# Patient Record
Sex: Female | Born: 1960 | Race: White | Hispanic: No | Marital: Married | Smoking: Former smoker
Health system: Southern US, Community
[De-identification: ages and names within clinical notes are randomized; demographics above are authoritative.]

## PROBLEM LIST (undated history)

## (undated) DIAGNOSIS — E119 Type 2 diabetes mellitus without complications: Secondary | ICD-10-CM

## (undated) HISTORY — DX: Type 2 diabetes mellitus without complications: E11.9

---
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
## 2015-04-24 LAB — HEPATIC FUNCTION PANEL
ALT: 25 U/L (ref 7–35)
AST: 21 U/L (ref 13–35)

## 2015-04-24 LAB — BASIC METABOLIC PANEL
BUN: 17 mg/dL (ref 4–21)
Creatinine: 0.9 mg/dL (ref <=1.1)
Potassium: 5 mmol/L (ref 3.4–5.3)
Sodium: 138 mmol/L (ref 137–147)

## 2015-07-14 LAB — BASIC METABOLIC PANEL: Glucose: 94 mg/dL

## 2015-08-31 LAB — HM DIABETES EYE EXAM

## 2015-10-20 ENCOUNTER — Other Ambulatory Visit: Payer: Self-pay

## 2015-10-20 DIAGNOSIS — Z1231 Encounter for screening mammogram for malignant neoplasm of breast: Secondary | ICD-10-CM

## 2015-10-23 LAB — LIPID PANEL
Cholesterol: 196 mg/dL (ref 0–200)
HDL: 147 mg/dL — AB (ref 35–70)
Triglycerides: 145 mg/dL (ref 40–160)

## 2015-10-23 LAB — HEMOGLOBIN A1C: Hemoglobin A1C: 8.3

## 2015-11-03 ENCOUNTER — Other Ambulatory Visit: Payer: Self-pay | Admitting: Physician Assistant

## 2015-11-03 ENCOUNTER — Ambulatory Visit
Admission: RE | Admit: 2015-11-03 | Discharge: 2015-11-03 | Disposition: A | Discharge status: Home / Self Care | Payer: Managed Care, Other (non HMO) | Source: Ambulatory Visit

## 2015-11-03 DIAGNOSIS — Z1231 Encounter for screening mammogram for malignant neoplasm of breast: Secondary | ICD-10-CM

## 2016-02-02 ENCOUNTER — Encounter: Payer: Self-pay | Admitting: Internal Medicine

## 2016-02-02 ENCOUNTER — Ambulatory Visit (INDEPENDENT_AMBULATORY_CARE_PROVIDER_SITE_OTHER): Payer: BC Managed Care – PPO | Admitting: Internal Medicine

## 2016-02-02 VITALS — BP 110/62 | HR 75 | Ht 67.5 in | Wt 213.0 lb

## 2016-02-02 DIAGNOSIS — E1165 Type 2 diabetes mellitus with hyperglycemia: Secondary | ICD-10-CM | POA: Insufficient documentation

## 2016-02-02 LAB — POCT GLYCOSYLATED HEMOGLOBIN (HGB A1C): Hemoglobin A1C: 7.2

## 2016-02-02 MED ORDER — GLIPIZIDE 5 MG PO TABS: 5.0000 mg | ORAL_TABLET | Freq: Two times a day (BID) | ORAL | 3 refills | Status: DC

## 2016-02-02 MED ORDER — SITAGLIPTIN PHOSPHATE 100 MG PO TABS
100.0000 mg | ORAL_TABLET | Freq: Every day | ORAL | 5 refills | Status: DC
Start: 2016-02-02 — End: 2016-07-21

## 2016-02-02 NOTE — Progress Notes (Signed)
Patient ID: Shelly LeaderChristine Babineaux, female   DOB: 31-Mar-1961, 55 y.o.   MRN: [id number]   HPI: Shelly Morgan is a 55 y.o.-year-old female, referred by her PCP, Manon HildingJessica Mauney, PA, for management of DM2, dx in 2008, GDM initially in 2002, non-insulin-dependent, uncontrolled, without long term complications. On BCBS.  Last hemoglobin A1c was: 01/25/2016: 7.4% Lab Results  Component Value Date   HGBA1C 8.3 10/23/2015  04/24/2015: HbA1c 7.9% 04/21/2014: HbA1c 6.9%  Pt is on a regimen of: - Metformin 1000 mg 2x a day, with meals - Amaryl 4 mg 2x a day before meals Tried Trulicity 0.75 mg weekly - started 10/2015 >> stopped 12/15/2015 Victoza was not covered.  Pt checks her sugars 1x a day and they are: - am: 135-175, 191 - 2h after b'fast: n/c - before lunch: 150-173 - 2h after lunch: 149 - before dinner: 95-131, 260 - 2h after dinner: n/c - bedtime: 178 - nighttime: n/c No lows. Lowest sugar was 95; she has hypoglycemia awareness at 95.  Highest sugar was 260.  Glucometer: One Touch Verio  Pt's meals are: - Breakfast: Enterex or Eggs  + 1/2 Bagel - Lunch: 1/2 PB sandwich or salad or veggies or Enterex - Dinner: chicken or pork + veggies or salad  - Snacks: 2: cottage cheese, fruit, veggies, nuts Drinks: 1 diet sodas a day (Dr Reino KentPepper). No coffee or tea. She has seen nutrition 3 times in the past. For exercise, she walks 3-5 times a week  - no CKD, last BUN/creatinine:  Lab Results  Component Value Date   BUN 17 04/24/2015   CREATININE 0.9 04/24/2015   - last set of lipids: Lab Results  Component Value Date   CHOL 196 10/23/2015   HDL 147 (A) 10/23/2015   TRIG 145 10/23/2015  LDL 118 - last eye exam was in Spring 2017. No DR.  - no numbness and tingling in her feet. On ASA 81 mg.  Pt has FH of DM in father.  ROS: Constitutional: no weight gain/loss, no fatigue, no subjective hyperthermia/hypothermia, + Nocturia Eyes: no blurry vision, no xerophthalmia ENT: no sore  throat, no nodules palpated in throat, no dysphagia/odynophagia, no hoarseness Cardiovascular: no CP/SOB/palpitations/leg swelling Respiratory: no cough/SOB Gastrointestinal: no N/V/D/C Musculoskeletal: no muscle/joint aches Skin: no rashes Neurological: no tremors/numbness/tingling/dizziness Psychiatric: no depression/anxiety  Past Medical History:  Diagnosis Date  . Diabetes mellitus without complication [facility](HCC)    Past Surgical History:  Procedure Laterality Date  . APPENDECTOMY  1974  . CESAREAN SECTION  2002  . OVARIAN CYST SURGERY  1974   Social History   Social History  . Marital status: Married    Spouse name: N/A  . Number of children: 1   Occupational History  . 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    Social History Main Topics  . Smoking status: Former Smoker    Types: Cigarettes    Quit date: 1997  . Smokeless tobacco: Not on file  . Alcohol use Yes     Comment: 1-2 year  . Drug use: no   Current Outpatient Prescriptions on File Prior to Visit  Medication Sig Dispense Refill  . aspirin 81 MG chewable tablet Chew by mouth daily.    . diphenhydrAMINE (BENADRYL) 25 mg capsule Take 25 mg by mouth 2 (two) times daily.    Marland Kitchen. glimepiride (AMARYL) 4 MG tablet Take 4 mg by mouth 2 (two) times daily.    .Marland Kitchen  metFORMIN (GLUCOPHAGE) 1000 MG tablet Take 1,000 mg by mouth 2 (two) times daily with a meal.     No current facility-administered medications on file prior to visit.    Allergies  Allergen Reactions  . Codeine Nausea And Vomiting  . Morphine And Related Nausea And Vomiting   Family History  Problem Relation Age of Onset  . COPD Mother   . Diverticulitis Father   . Crohn's disease Brother    PE: BP 110/62 (BP Location: Left Arm, Patient Position: Sitting)   Pulse 75   Ht 5' 7.5" (1.715 m)   Wt 213 lb (96.6 kg)   SpO2 94%   BMI 32.87 kg/m  Wt Readings from Last 3  Encounters:  02/02/16 213 lb (96.6 kg)   Constitutional: overweight, in NAD Eyes: PERRLA, EOMI, no exophthalmos ENT: moist mucous membranes, no thyromegaly, no cervical lymphadenopathy Cardiovascular: RRR, No MRG Respiratory: CTA B Gastrointestinal: abdomen soft, NT, ND, BS+ Musculoskeletal: no deformities, strength intact in all 4 Skin: moist, warm, no rashes Neurological: no tremor with outstretched hands, DTR normal in all 4  ASSESSMENT: 1. DM2, non-insulin-dependent, uncontrolled, without long term complications, but with hyperglycemia  PLAN:  1. Patient with long-standing, uncontrolled diabetes, on oral antidiabetic regimen, with improvement in DM control after adding Trulicity however, she could not tolerate this (even at the lowest dose, and 0.75 mg weekly) because of nausea. After she stopped the Trulicity a month and a half ago, she tried to exercise (walking) consistently, and started to change her diet. Her HbA1c 7.2%, definitely improved from the previous value of 8.3%. This could be the efect of Trulicity, but I believe that this is also due to her efforts towards improving her diabetes. - since she responded so well to Trulicity, will try to start a DPP 4 inhibitor to further help with her sugars. I strongly advised her to continue exercise and also suggested diet changes. - We'll also switch from Amaryl to glipizide. - I suggested to:  Patient Instructions  Please continue: - Metformin 1000 mg 2x a day, with meals  Please stop: - Amaryl  Please start: - Januvia 100 mg in am, before b'fast - Glipizide 5 mg 2x a day before b'fast and dinner  Please let me know if the sugars are consistently <80 or >200.  Please return in 1.5 months with your sugar log.   - Strongly advised her to start checking sugars at different times of the day - check 1-2 times a day, rotating checks - given sugar log and advised how to fill it and to bring it at next appt  - given foot care  handout and explained the principles  - given instructions for hypoglycemia management "15-15 rule"  - advised for yearly eye exams >> She is up-to-date - Return to clinic in 1.5 mo with sugar log   Carlus Pavlov, MD PhD [facility] Endocrinology

## 2016-02-02 NOTE — Addendum Note (Signed)
Addended by: Darene LamerHOMPSON, Azarias Chiou T on: 02/02/2016 09:24 AM   Modules accepted: Orders

## 2016-02-02 NOTE — Patient Instructions (Addendum)
Please continue: - Metformin 1000 mg 2x a day, with meals  Please stop: - Amaryl  Please start: - Januvia 100 mg in am, before b'fast - Glipizide 5 mg 2x a day before b'fast and dinner  Please let me know if the sugars are consistently <80 or >200.  Please return in 1.5 months with your sugar log.   PATIENT INSTRUCTIONS FOR TYPE 2 DIABETES:  **Please join MyChart!** - see attached instructions about how to join if you have not done so already.  DIET AND EXERCISE Diet and exercise is an important part of diabetic treatment.  We recommended aerobic exercise in the form of brisk walking (working between 40-60% of maximal aerobic capacity, similar to brisk walking) for 150 minutes per week (such as 30 minutes five days per week) along with 3 times per week performing 'resistance' training (using various gauge rubber tubes with handles) 5-10 exercises involving the major muscle groups (upper body, lower body and core) performing 10-15 repetitions (or near fatigue) each exercise. Start at half the above goal but build slowly to reach the above goals. If limited by weight, joint pain, or disability, we recommend daily walking in a swimming pool with water up to waist to reduce pressure from joints while allow for adequate exercise.    BLOOD GLUCOSES Monitoring your blood glucoses is important for continued management of your diabetes. Please check your blood glucoses 2-4 times a day: fasting, before meals and at bedtime (you can rotate these measurements - e.g. one day check before the 3 meals, the next day check before 2 of the meals and before bedtime, etc.).   HYPOGLYCEMIA (low blood sugar) Hypoglycemia is usually a reaction to not eating, exercising, or taking too much insulin/ other diabetes drugs.  Symptoms include tremors, sweating, hunger, confusion, headache, etc. Treat IMMEDIATELY with 15 grams of Carbs: . 4 glucose tablets .  cup regular juice/soda . 2 tablespoons raisins . 4  teaspoons sugar . 1 tablespoon honey Recheck blood glucose in 15 mins and repeat above if still symptomatic/blood glucose <100.  RECOMMENDATIONS TO REDUCE YOUR RISK OF DIABETIC COMPLICATIONS: * Take your prescribed MEDICATION(S) * Follow a DIABETIC diet: Complex carbs, fiber rich foods, (monounsaturated and polyunsaturated) fats * AVOID saturated/trans fats, high fat foods, >2,300 mg salt per day. * EXERCISE at least 5 times a week for 30 minutes or preferably daily.  * DO NOT SMOKE OR DRINK more than 1 drink a day. * Check your FEET every day. Do not wear tightfitting shoes. Contact us if you develop an ulcer * See your EYE doctor once a year or more if needed * Get a FLU shot once a year * Get a PNEUMONIA vaccine once before and once after age 56 years  GOALS:  * Your Hemoglobin A1c of <7%  * fasting sugars need to be <130 * after meals sugars need to be <180 (2h after you start eating) * Your Systolic BP should be 140 or lower  * Your Diastolic BP should be 80 or lower  * Your HDL (Good Cholesterol) should be 40 or higher  * Your LDL (Bad Cholesterol) should be 100 or lower. * Your Triglycerides should be 150 or lower  * Your Urine microalbumin (kidney function) should be <30 * Your Body Mass Index should be 25 or lower    Please consider the following ways to cut down carbs and fat and increase fiber and micronutrients in your diet: - substitute whole grain for white bread or  pasta - substitute brown rice for white rice - substitute 90-calorie flat bread pieces for slices of bread when possible - substitute sweet potatoes or yams for white potatoes - substitute humus for margarine - substitute tofu for cheese when possible - substitute almond or rice milk for regular milk (would not drink soy milk daily due to concern for soy estrogen influence on breast cancer risk) - substitute dark chocolate for other sweets when possible - substitute water - can add lemon or orange slices  for taste - for diet sodas (artificial sweeteners will trick your body that you can eat sweets without getting calories and will lead you to overeating and weight gain in the long run) - do not skip breakfast or other meals (this will slow down the metabolism and will result in more weight gain over time)  - can try smoothies made from fruit and almond/rice milk in am instead of regular breakfast - can also try old-fashioned (not instant) oatmeal made with almond/rice milk in am - order the dressing on the side when eating salad at a restaurant (pour less than half of the dressing on the salad) - eat as little meat as possible - can try juicing, but should not forget that juicing will get rid of the fiber, so would alternate with eating raw veg./fruits or drinking smoothies - use as little oil as possible, even when using olive oil - can dress a salad with a mix of balsamic vinegar and lemon juice, for e.g. - use agave nectar, stevia sugar, or regular sugar rather than artificial sweateners - steam or broil/roast veggies  - snack on veggies/fruit/nuts (unsalted, preferably) when possible, rather than processed foods - reduce or eliminate aspartame in diet (it is in diet sodas, chewing gum, etc) Read the labels!  Try to read Dr. Janene Harvey book: "Program for Reversing Diabetes" for other ideas for healthy eating.

## 2016-03-18 ENCOUNTER — Ambulatory Visit (INDEPENDENT_AMBULATORY_CARE_PROVIDER_SITE_OTHER): Payer: BC Managed Care – PPO | Admitting: Internal Medicine

## 2016-03-18 ENCOUNTER — Encounter: Payer: Self-pay | Admitting: Internal Medicine

## 2016-03-18 VITALS — BP 114/72 | HR 77 | Ht 67.5 in | Wt 212.0 lb

## 2016-03-18 DIAGNOSIS — E1165 Type 2 diabetes mellitus with hyperglycemia: Secondary | ICD-10-CM

## 2016-03-18 MED ORDER — EMPAGLIFLOZIN 25 MG PO TABS: 25.0000 mg | ORAL_TABLET | Freq: Every day | ORAL | 5 refills | Status: DC

## 2016-03-18 NOTE — Patient Instructions (Addendum)
Please continue: - Metformin 1000 mg 2x a day, with meals - Januvia 100 mg in am, before b'fast - Glipizide 5 mg 2x a day before b'fast and dinner  Add Jardiance 25 mg before b'fast.  If sugars in am are not <130 after 3 weeks, please try to move all Metformin (2000 mg) at dinnertime.  Please return in 1.5 months with your sugar log.

## 2016-03-18 NOTE — Progress Notes (Signed)
Patient ID: Shelly Morgan, female   DOB: 10-25-1960, 55 y.o.   MRN: [id number]   HPI: Shelly Morgan is a 55 y.o.-year-old female, initially referred by her PCP, Shelly Hilding, PA, for management of DM2, dx in 2008, GDM initially in 2002, non-insulin-dependent, uncontrolled, without long term complications. Last visit 6 weeks ago. On BC[employer].  Last hemoglobin A1c was: Lab Results  Component Value Date   HGBA1C 7.2 02/02/2016   HGBA1C 8.3 10/23/2015  01/25/2016: 7.4% 04/24/2015: HbA1c 7.9% 04/21/2014: HbA1c 6.9%  Pt was on a regimen of: - Metformin 1000 mg 2x a day, with meals - Amaryl 4 mg 2x a day before meals Tried Trulicity 0.75 mg weekly - started 10/2015 >> stopped 12/15/2015 b/c nausea, GERD Victoza was not covered.  At last visit, we switched to: - Metformin 1000 mg 2x a day, with meals - Januvia 100 mg in am, before b'fast - Glipizide 5 mg 2x a day before b'fast and dinner  Pt checks her sugars 1-2x a day and they are higher in last 2 weeks (? Reason): - am: 135-175, 191 >> 141-207 - 2h after b'fast: n/c >> 82, 112-224 - before lunch: 150-173 >> 99-107 - 2h after lunch: 149 >> 110-161 - before dinner: 95-131, 260 >> 70, 110-237 - 2h after dinner: n/c >> 142 - bedtime: 178 >> 94-140 - nighttime: n/c >> 189 No lows. Lowest sugar was 95 >> 70; she has hypoglycemia awareness at 95.  Highest sugar was 260 >> 237.  Glucometer: One Touch Verio  Pt's meals are: - Breakfast: Enterex or Eggs  + 1/2 Bagel - Lunch: 1/2 PB sandwich or salad or veggies or Enterex - Dinner: chicken or pork + veggies or salad  - Snacks: 2: cottage cheese, fruit, veggies, nuts Drinks: 1 diet sodas a day (Dr Reino Kent). No coffee or tea. She has seen nutrition 3 times in the past. For exercise, she walks 3-5 times a week  - no CKD, last BUN/creatinine:  Lab Results  Component Value Date   BUN 17 04/24/2015   CREATININE 0.9 04/24/2015   - last set of lipids: Lab Results  Component Value  Date   CHOL 196 10/23/2015   HDL 147 (A) 10/23/2015   TRIG 145 10/23/2015  LDL 118 - last eye exam was in Spring 2017. No DR.  - no numbness and tingling in her feet. On ASA 81 mg.  ROS: Constitutional: no weight gain/loss, no fatigue, no subjective hyperthermia/hypothermia, no Nocturia Eyes: no blurry vision, no xerophthalmia ENT: no sore throat, no nodules palpated in throat, no dysphagia/odynophagia, no hoarseness Cardiovascular: no CP/SOB/palpitations/leg swelling Respiratory: no cough/SOB Gastrointestinal: no N/V/D/C/+ heartburn Musculoskeletal: no muscle/joint aches Skin: no rashes Neurological: no tremors/numbness/tingling/dizziness  I reviewed pt's medications, allergies, PMH, social hx, family hx, and changes were documented in the history of present illness. Otherwise, unchanged from my initial visit note.  Past Medical History:  Diagnosis Date  . Diabetes mellitus without complication [facility])    Past Surgical History:  Procedure Laterality Date  . APPENDECTOMY  1974  . CESAREAN SECTION  2002  . OVARIAN CYST SURGERY  1974   Social History   Social History  . Marital status: Married    Spouse name: N/A  . Number of children: 1   Occupational History  . 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    Social History Main Topics  . Smoking status: Former Smoker  Types: Cigarettes    Quit date: [id number]  . Smokeless tobacco: Not on file  . Alcohol use Yes     Comment: 1-2 year  . Drug use: no   Current Outpatient Prescriptions on File Prior to Visit  Medication Sig Dispense Refill  . aspirin 81 MG chewable tablet Chew by mouth daily.    . diphenhydrAMINE (BENADRYL) 25 mg capsule Take 25 mg by mouth 2 (two) times daily.    Marland Kitchen. glipiZIDE (GLUCOTROL) 5 MG tablet Take 1 tablet (5 mg total) by mouth 2 (two) times daily before a meal. 180 tablet 3  . metFORMIN (GLUCOPHAGE) 1000 MG tablet Take  1,000 mg by mouth 2 (two) times daily with a meal.    . ONETOUCH DELICA LANCETS 33G MISC daily. use as directed  3  . ONETOUCH VERIO test strip     . sitaGLIPtin (JANUVIA) 100 MG tablet Take 1 tablet (100 mg total) by mouth daily. 30 tablet 5   No current facility-administered medications on file prior to visit.    Allergies  Allergen Reactions  . Codeine Nausea And Vomiting  . Morphine And Related Nausea And Vomiting   Family History  Problem Relation Age of Onset  . COPD Mother   . Diverticulitis Father   . Crohn's disease Brother    PE: BP 114/72 (BP Location: Left Arm, Patient Position: Sitting)   Pulse 77   Ht 5' 7.5" (1.715 m)   Wt 212 lb (96.2 kg)   SpO2 95%   BMI 32.71 kg/m  Wt Readings from Last 3 Encounters:  03/18/16 212 lb (96.2 kg)  02/02/16 213 lb (96.6 kg)   Constitutional: overweight, in NAD Eyes: PERRLA, EOMI, no exophthalmos ENT: moist mucous membranes, no thyromegaly, no cervical lymphadenopathy Cardiovascular: RRR, No MRG Respiratory: CTA B Gastrointestinal: abdomen soft, NT, ND, BS+ Musculoskeletal: no deformities, strength intact in all 4 Skin: moist, warm, no rashes Neurological: no tremor with outstretched hands, DTR normal in all 4  ASSESSMENT: 1. DM2, non-insulin-dependent, uncontrolled, without long term complications, but with hyperglycemia  PLAN:  1. Patient with long-standing, uncontrolled diabetes, on oral antidiabetic regimen, with improvement in DM control after adding Trulicity however, she could not tolerate this (even at the lowest dose, and 0.75 mg weekly) because of nausea. After she stopped Trulicity a month and a half ago, she started to exercise (walking) consistently, and started to change her diet. Her HbA1c 7.2%, definitely improved from the previous value of 8.3%. At last visit, we changed from Amaryl to Glipizide and Januvia. Sugars are worse in am. Will add Jardiance. - I suggested to:  Patient Instructions  Please  continue: - Metformin 1000 mg 2x a day, with meals - Januvia 100 mg in am, before b'fast - Glipizide 5 mg 2x a day before b'fast and dinner  Add Jardiance 25 mg before b'fast.  If sugars in am are not <130 after 3 weeks, please try to move all Metformin (2000 mg) at dinnertime.  Please return in 1.5 months with your sugar log.   - continue checking sugars at different times of the day - check 1-2 times a day, rotating checks - advised for yearly eye exams >> She is up-to-date - refuses flu shot: "I never get sick" - Return to clinic in 1.5 mo with sugar log   Carlus Pavlovristina Duvid Smalls, MD PhD [facility]

## 2016-03-25 ENCOUNTER — Telehealth: Payer: Self-pay | Admitting: Internal Medicine

## 2016-03-25 NOTE — Telephone Encounter (Signed)
Patient stated she has a yeast infection form the medication Jardiance,  Can you prescribe something  For it. Send to [id number]- [place], [place] - [address] [phone] (Phone) [phone] (Fax)

## 2016-03-26 ENCOUNTER — Other Ambulatory Visit: Payer: Self-pay

## 2016-03-26 MED ORDER — FLUCONAZOLE 150 MG PO TABS: 150.0000 mg | ORAL_TABLET | Freq: Once | ORAL | 1 refills | Status: AC

## 2016-03-26 NOTE — Telephone Encounter (Signed)
Yes, Diflucan 150 mg 1 tablet with one refill

## 2016-03-26 NOTE — Telephone Encounter (Signed)
Patient telephoned again wondering where the prescription for her yeast is.

## 2016-04-29 ENCOUNTER — Other Ambulatory Visit (HOSPITAL_COMMUNITY)
Admission: RE | Admit: 2016-04-29 | Discharge: 2016-04-29 | Disposition: A | Discharge status: Home / Self Care | Payer: BC Managed Care – PPO | Source: Ambulatory Visit | Attending: Physician Assistant | Admitting: Physician Assistant

## 2016-04-29 ENCOUNTER — Other Ambulatory Visit: Payer: Self-pay | Admitting: Physician Assistant

## 2016-04-29 DIAGNOSIS — Z124 Encounter for screening for malignant neoplasm of cervix: Secondary | ICD-10-CM | POA: Diagnosis present

## 2016-04-29 DIAGNOSIS — Z1151 Encounter for screening for human papillomavirus (HPV): Secondary | ICD-10-CM | POA: Diagnosis not present

## 2016-05-02 LAB — CYTOLOGY - PAP
Diagnosis: UNDETERMINED — AB
HPV: NOT DETECTED

## 2016-05-08 ENCOUNTER — Ambulatory Visit: Payer: BC Managed Care – PPO | Admitting: Internal Medicine

## 2016-06-25 ENCOUNTER — Ambulatory Visit (INDEPENDENT_AMBULATORY_CARE_PROVIDER_SITE_OTHER): Payer: BC Managed Care – PPO | Admitting: Internal Medicine

## 2016-06-25 ENCOUNTER — Encounter: Payer: Self-pay | Admitting: Internal Medicine

## 2016-06-25 VITALS — BP 118/80 | HR 74 | Wt 207.0 lb

## 2016-06-25 DIAGNOSIS — E1165 Type 2 diabetes mellitus with hyperglycemia: Secondary | ICD-10-CM

## 2016-06-25 LAB — BASIC METABOLIC PANEL WITH GFR
BUN: 19 mg/dL (ref 7–25)
CO2: 26 mmol/L (ref 20–31)
Calcium: 9.7 mg/dL (ref 8.6–10.4)
Chloride: 105 mmol/L (ref 98–110)
Creat: 1.03 mg/dL (ref 0.50–1.05)
GFR, Est African American: 70 mL/min (ref >=60)
GFR, Est Non African American: 61 mL/min (ref >=60)
Glucose, Bld: 146 mg/dL — ABNORMAL HIGH (ref 65–99)
Potassium: 4.5 mmol/L (ref 3.5–5.3)
Sodium: 139 mmol/L (ref 135–146)

## 2016-06-25 LAB — POCT GLYCOSYLATED HEMOGLOBIN (HGB A1C): Hemoglobin A1C: 6.4

## 2016-06-25 NOTE — Patient Instructions (Addendum)
Please continue: - Metformin 2000 mg 1x a day, with dinner - Januvia 100 mg in am, before b'fast - Glipizide 5 mg 2x a day before b'fast and dinner  - Jardiance 25 mg before b'fast.  Please return in 3 months with your sugar log.   Build up exercise.  Please stop at the lab.

## 2016-06-25 NOTE — Progress Notes (Signed)
Patient ID: Shelly Morgan, female   DOB: [birth year], 56 y.o.   MRN: [id number]   HPI: Shelly Morgan is a 56 y.o.-year-old female, initially referred by her PCP, Manon Hilding, PA, for management of DM2, dx in 2008, GDM initially in 2002, non-insulin-dependent, uncontrolled, without long term complications. Last visit 4 mo ago. On [employer].  She lost 5 lbs since last visit. She is eating mostly a vegetarian diet.   Last hemoglobin A1c was: Lab Results  Component Value Date   HGBA1C 7.2 02/02/2016   HGBA1C 8.3 10/23/2015  01/25/2016: 7.4% 04/24/2015: HbA1c 7.9% 04/21/2014: HbA1c 6.9%  Pt was on a regimen of: - Metformin 1000 mg 2x a day - Amaryl 4 mg 2x a day before meals Tried Trulicity 0.75 mg weekly - started 10/2015 >> stopped 12/15/2015 b/c nausea, GERD Victoza was not covered.  She is on: - Metformin 2000 mg with dinner - Januvia 100 mg in am, before b'fast - Glipizide 5 mg 2x a day before b'fast and dinner  - Jardiance 25 mg before b'fast. - started 03/2016 - had 1 yeast inf.  Pt checks her sugars 1-2x a day: - am: 135-175, 191 >> 141-207 >> 146-196 - 2h after b'fast: n/c >> 82, 112-224 >> 137-203, 295 - before lunch: 150-173 >> 99-107 >> 139 - 2h after lunch: 149 >> 110-161 >> 116-174 - before dinner: 95-131, 260 >> 70, 110-237 >> 89, 116-154 - 2h after dinner: n/c >> 142 >> 119 - bedtime: 178 >> 94-140 >> 114-217 - nighttime: n/c >> 189 No lows. Lowest sugar was 95 >> 70 >> 89; she has hypoglycemia awareness at 95.  Highest sugar was 260 >> 237 >> 295 (cereals).  Glucometer: One Touch Verio  Pt's meals are: - Breakfast: Enterex or Eggs  + 1/2 Bagel - Lunch: 1/2 PB sandwich or salad or veggies or Enterex - Dinner: chicken or pork + veggies or salad  - Snacks: 2: cottage cheese, fruit, veggies, nuts Drinks: 1 diet sodas a day (Dr Reino Kent). No coffee or tea. She has seen nutrition 3 times in the past. For exercise, she walks 3-5 times a week 1 mile - 18 min.  -  no CKD, last BUN/creatinine:  Lab Results  Component Value Date   BUN 17 04/24/2015   CREATININE 0.9 04/24/2015   - last set of lipids: Lab Results  Component Value Date   CHOL 196 10/23/2015   HDL 147 (A) 10/23/2015   TRIG 145 10/23/2015  LDL 118 - last eye exam was in Spring 2017. No DR.  - no numbness and tingling in her feet. On ASA 81 mg.  ROS: Constitutional: no weight gain/loss, no fatigue, no subjective hyperthermia/hypothermia, no Nocturia Eyes: no blurry vision, no xerophthalmia ENT: no sore throat, no nodules palpated in throat, no dysphagia/odynophagia, no hoarseness Cardiovascular: no CP/SOB/palpitations/leg swelling Respiratory: no cough/SOB Gastrointestinal: no N/V/D/C/heartburn Musculoskeletal: no muscle/joint aches Skin: no rashes Neurological: no tremors/numbness/tingling/dizziness  I reviewed pt's medications, allergies, PMH, social hx, family hx, and changes were documented in the history of present illness. Otherwise, unchanged from my initial visit note.  Past Medical History:  Diagnosis Date  . Diabetes mellitus without complication [facility])    Past Surgical History:  Procedure Laterality Date  . APPENDECTOMY  1974  . CESAREAN SECTION  2002  . OVARIAN CYST SURGERY  1974   Social History   Social History  . Marital status: Married    Spouse name: N/A  . Number of children: 1   Occupational  History  . 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    Social History Main Topics  . Smoking status: Former Smoker    Types: Cigarettes    Quit date: 1997  . Smokeless tobacco: Not on file  . Alcohol use Yes     Comment: 1-2 year  . Drug use: no   Current Outpatient Prescriptions on File Prior to Visit  Medication Sig Dispense Refill  . aspirin 81 MG chewable tablet Chew by mouth daily.    . diphenhydrAMINE (BENADRYL) 25 mg capsule Take 25 mg by mouth 2 (two) times daily.     . empagliflozin (JARDIANCE) 25 MG TABS tablet Take 25 mg by mouth daily. 30 tablet 5  . glipiZIDE (GLUCOTROL) 5 MG tablet Take 1 tablet (5 mg total) by mouth 2 (two) times daily before a meal. 180 tablet 3  . metFORMIN (GLUCOPHAGE) 1000 MG tablet Take 1,000 mg by mouth 2 (two) times daily with a meal.    . ONETOUCH DELICA LANCETS 33G MISC daily. use as directed  3  . ONETOUCH VERIO test strip     . sitaGLIPtin (JANUVIA) 100 MG tablet Take 1 tablet (100 mg total) by mouth daily. 30 tablet 5   No current facility-administered medications on file prior to visit.    Allergies  Allergen Reactions  . Codeine Nausea And Vomiting  . Morphine And Related Nausea And Vomiting   Family History  Problem Relation Age of Onset  . COPD Mother   . Diverticulitis Father   . Crohn's disease Brother    PE: BP 118/80 (BP Location: Right Arm, Patient Position: Sitting)   Pulse 74   Wt 207 lb (93.9 kg)   SpO2 97%   BMI 31.94 kg/m  Wt Readings from Last 3 Encounters:  06/25/16 207 lb (93.9 kg)  03/18/16 212 lb (96.2 kg)  02/02/16 213 lb (96.6 kg)   Constitutional: overweight, in NAD Eyes: PERRLA, EOMI, no exophthalmos ENT: moist mucous membranes, no thyromegaly, no cervical lymphadenopathy Cardiovascular: RRR, No MRG Respiratory: CTA B Gastrointestinal: abdomen soft, NT, ND, BS+ Musculoskeletal: no deformities, strength intact in all 4 Skin: moist, warm, no rashes Neurological: no tremor with outstretched hands, DTR normal in all 4  ASSESSMENT: 1. DM2, non-insulin-dependent, uncontrolled, without long term complications, but with hyperglycemia  PLAN:  1. Patient with long-standing, uncontrolled diabetes, on oral antidiabetic regimen, with improvement in DM control after adding Trulicity however, she could not tolerate this (even at the lowest dose, and 0.75 mg weekly) because of nausea. After she stopped Trulicity, she started to exercise (walking) consistently, and started to change her  diet. Her HbA1c improved but sugars high in am at last visit >> we added Jardiance. She had 1 yeast inf. Sugars remain high in am. Discussed about reducing fat in her diet and also starting a more intense exercise regimen. - HbA1c today was great: 6.4%! - I suggested to:  Patient Instructions  Please continue: - Metformin 1000 mg 2x a day, with meals - Januvia 100 mg in am, before b'fast - Glipizide 5 mg 2x a day before b'fast and dinner  - Jardiance 25 mg before b'fast.  Please return in 3 months with your sugar log.   Please stop at the lab.  - continue checking sugars at different times of the day - check 1-2 times a day, rotating checks - advised for yearly eye exams >> She is up-to-date -  will check potassium and GFR as we started SGLT2 inh at last visit. - refused flu shot: "I never get sick" - Return to clinic in 3 mo with sugar log   Office Visit on 06/25/2016  Component Date Value Ref Range Status  . Sodium 06/25/2016 139  135 - 146 mmol/L Final  . Potassium 06/25/2016 4.5  3.5 - 5.3 mmol/L Final  . Chloride 06/25/2016 105  98 - 110 mmol/L Final  . CO2 06/25/2016 26  20 - 31 mmol/L Final  . Glucose, Bld 06/25/2016 146* 65 - 99 mg/dL Final  . BUN 09/81/191401/23/2018 19  7 - 25 mg/dL Final  . Creat 78/29/562101/23/2018 1.03  0.50 - 1.05 mg/dL Final   Comment:   For patients > or = 57 years of age: The upper reference limit for Creatinine is approximately 13% higher for people identified as African-American.     . Calcium 06/25/2016 9.7  8.6 - 10.4 mg/dL Final  . GFR, Est African American 06/25/2016 70  >=60 mL/min Final  . GFR, Est Non African American 06/25/2016 61  >=60 mL/min Final  . Hemoglobin A1C 06/25/2016 6.4   Final     Carlus Pavlovristina Shadie Sweatman, MD PhD [facility]

## 2016-06-26 ENCOUNTER — Telehealth: Payer: Self-pay

## 2016-06-26 NOTE — Telephone Encounter (Signed)
Called patient and gave lab results. Patient had no questions or concerns.  

## 2016-07-21 ENCOUNTER — Other Ambulatory Visit: Payer: Self-pay | Admitting: Internal Medicine

## 2016-09-05 ENCOUNTER — Other Ambulatory Visit: Payer: Self-pay | Admitting: Internal Medicine

## 2016-09-25 ENCOUNTER — Ambulatory Visit: Payer: BC Managed Care – PPO | Admitting: Internal Medicine

## 2016-11-01 ENCOUNTER — Ambulatory Visit (INDEPENDENT_AMBULATORY_CARE_PROVIDER_SITE_OTHER): Payer: BC Managed Care – PPO | Admitting: Internal Medicine

## 2016-11-01 ENCOUNTER — Encounter: Payer: Self-pay | Admitting: Internal Medicine

## 2016-11-01 VITALS — BP 110/73 | HR 65 | Ht 67.5 in | Wt 208.8 lb

## 2016-11-01 DIAGNOSIS — E1165 Type 2 diabetes mellitus with hyperglycemia: Secondary | ICD-10-CM | POA: Diagnosis not present

## 2016-11-01 LAB — POCT GLYCOSYLATED HEMOGLOBIN (HGB A1C): Hemoglobin A1C: 6.6

## 2016-11-01 MED ORDER — DULAGLUTIDE 0.75 MG/0.5ML ~~LOC~~ SOAJ: 0.7500 mg | SUBCUTANEOUS | 3 refills | Status: DC

## 2016-11-01 MED ORDER — ASPIRIN 81 MG PO TBEC: 81.0000 mg | DELAYED_RELEASE_TABLET | Freq: Every day | ORAL | Status: DC

## 2016-11-01 NOTE — Progress Notes (Signed)
Patient ID: Shelly Morgan, female   DOB: 30-Apr-1961, 56 y.o.   MRN: [id number]   HPI: Shelly Morgan is a 56 y.o.-year-old female, initially referred by her PCP, Manon Hilding, PA, for management of DM2, dx in 2008, GDM initially in 2002, non-insulin-dependent, uncontrolled, without long term complications. Last visit 4.5 mo ago. On [employer].  Last hemoglobin A1c was: Lab Results  Component Value Date   HGBA1C 6.4 06/25/2016   HGBA1C 7.2 02/02/2016   HGBA1C 8.3 10/23/2015  01/25/2016: 7.4% 04/24/2015: HbA1c 7.9% 04/21/2014: HbA1c 6.9%  Pt was on a regimen of: - Metformin 1000 mg 2x a day - Amaryl 4 mg 2x a day before meals Tried Trulicity 0.75 mg weekly - started 10/2015 >> stopped 12/15/2015 b/c nausea, GERD Victoza was not covered.  She is now on: - Metformin 2000 mg with dinner - Januvia 100 mg in am, before b'fast - Glipizide 5 mg 2x a day before b'fast and dinner  - Jardiance 25 mg before b'fast. - started 03/2016 - had 1 yeast inf., but continues to have labial itching >> on a steroid cream + Azo cream 3x a day  Pt checks her sugars 1-2x a day: - am: 135-175, 191 >> 141-207 >> 146-196 >> 113-204 - 2h after b'fast: n/c >> 82, 112-224 >> 137-203, 295 >> 127, 128 - before lunch: 150-173 >> 99-107 >> 139 >> 131 - 2h after lunch: 149 >> 110-161 >> 116-174 >> 109, 111 - before dinner: 95-131, 260 >> 70, 110-237 >> 89, 116-154 >> 112-137, 188, 200 - 2h after dinner: n/c >> 142 >> 119 >> 101, 103 - bedtime: 178 >> 94-140 >> 114-217 >> 105 - nighttime: n/c >> 189 No lows. Lowest sugar was 95 >> 70 >> 89 >> 109; she has hypoglycemia awareness at 95.  Highest sugar was 260 >> 237 >> 295 (cereals) >> low 200s.  Glucometer: One Touch Verio  Pt's meals are: - Breakfast: Enterex or Eggs  + 1/2 Bagel - Lunch: 1/2 PB sandwich or salad or veggies or Enterex - Dinner: chicken or pork + veggies or salad  - Snacks: 2: cottage cheese, fruit, veggies, nuts Drinks: 1 diet sodas a day (Dr  Reino Kent). No coffee or tea. She has seen nutrition x3 in the past.  - No CKD, last BUN/creatinine:  Lab Results  Component Value Date   BUN 19 06/25/2016   BUN 17 04/24/2015   CREATININE 1.03 06/25/2016   CREATININE 0.9 04/24/2015   - Latest Lipids (reviewed [facility] records): Lab Results  Component Value Date   CHOL 196 10/23/2015   HDL 147 (A) 10/23/2015   TRIG 145 10/23/2015  LDL 118 - last eye exam was in Spring 2017. No DR. Scheduled 11/11/2016. - denies numbness and tingling in her feet. On ASA 81 mg.  ROS: Constitutional: no weight gain/no weight loss, no fatigue, no subjective hyperthermia, no subjective hypothermia Eyes: no blurry vision, no xerophthalmia ENT: no sore throat, no nodules palpated in throat, no dysphagia, no odynophagia, no hoarseness Cardiovascular: no CP/no SOB/no palpitations/no leg swelling Respiratory: no cough/no SOB/no wheezing Gastrointestinal: no N/no V/no D/no C/no acid reflux Musculoskeletal: no muscle aches/no joint aches Skin: no rashes, no hair loss, + vulvar itching Neurological: no tremors/no numbness/no tingling/no dizziness  I reviewed pt's medications, allergies, PMH, social hx, family hx, and changes were documented in the history of present illness. Otherwise, unchanged from my initial visit note.  Past Medical History:  Diagnosis Date  . Diabetes mellitus without complication (HCC)  Past Surgical History:  Procedure Laterality Date  . APPENDECTOMY  1974  . CESAREAN SECTION  2002  . OVARIAN CYST SURGERY  1974   Social History   Social History  . Marital status: Married    Spouse name: N/A  . Number of children: 1   Occupational History  . 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    Social History Main Topics  . Smoking status: Former Smoker    Types: Cigarettes    Quit date: 1997  . Smokeless tobacco: Not on file  . Alcohol use Yes      Comment: 1-2 year  . Drug use: no   Current Outpatient Prescriptions on File Prior to Visit  Medication Sig Dispense Refill  . aspirin 81 MG chewable tablet Chew by mouth daily.    . diphenhydrAMINE (BENADRYL) 25 mg capsule Take 25 mg by mouth 2 (two) times daily.    Marland Kitchen. glipiZIDE (GLUCOTROL) 5 MG tablet Take 1 tablet (5 mg total) by mouth 2 (two) times daily before a meal. 180 tablet 3  . JANUVIA 100 MG tablet TAKE 1 TABLET BY MOUTH EVERY DAY 30 tablet 5  . JARDIANCE 25 MG TABS tablet TAKE 1 TABLET BY MOUTH EVERY DAY 30 tablet 5  . metFORMIN (GLUCOPHAGE) 1000 MG tablet Take 1,000 mg by mouth 2 (two) times daily with a meal.    . ONETOUCH DELICA LANCETS 33G MISC daily. use as directed  3  . ONETOUCH VERIO test strip      No current facility-administered medications on file prior to visit.    Allergies  Allergen Reactions  . Codeine Nausea And Vomiting  . Morphine And Related Nausea And Vomiting   Family History  Problem Relation Age of Onset  . COPD Mother   . Diverticulitis Father   . Crohn's disease Brother    PE: BP 110/73   Pulse 65   Ht 5' 7.5" (1.715 m)   Wt 208 lb 12.8 oz (94.7 kg)   SpO2 98%   BMI 32.22 kg/m   Wt Readings from Last 3 Encounters:  11/01/16 208 lb 12.8 oz (94.7 kg)  06/25/16 207 lb (93.9 kg)  03/18/16 212 lb (96.2 kg)   Constitutional: overweight, in NAD Eyes: PERRLA, EOMI, no exophthalmos ENT: moist mucous membranes, no thyromegaly, no cervical lymphadenopathy Cardiovascular: RRR, No MRG Respiratory: CTA B Gastrointestinal: abdomen soft, NT, ND, BS+ Musculoskeletal: no deformities, strength intact in all 4 Skin: moist, warm, no rashes Neurological: no tremor with outstretched hands, DTR normal in all 4  ASSESSMENT: 1. DM2, non-insulin-dependent, uncontrolled, without long term complications, but with hyperglycemia  PLAN:  1. Patient with long-standing, uncontrolled diabetes, with better control later in the day in last few months, but still  high sugars in am.  - she is not tolerating well the Jardiance b/c vulvar itching >> will try to decrease the dose and if the itching persist >> will stop - she would like to retry Trulicity as she is not sure if her nausea that she experienced with it in the past was not related to something else >> we can do this if sugars higher off Jardiance. - I suggested to:  Patient Instructions  Please continue: - Metformin 1000 mg 2x a day, with meals - Januvia 100 mg in am, before b'fast - Glipizide 5 mg 2x a day before b'fast and dinner  Please decrease:  - Jardiance to 12.5  mg before b'fast for 1-2 weeks and if the itching persists, stop the Jardiance.  If you have to stop Jardiance and sugars increase >> try to restart Trulicity 0.75 mg weekly.   Please return in 3 months with your sugar log.   - today, HbA1c is 6.6% (slightly higher) - continue checking sugars at different times of the day - check 1x a day, rotating checks - advised for yearly eye exams >> she needs one >> scheduled - we need to check a BMP (if stays on Jardiance) + Lipids at next visit - Return to clinic in 3 mo with sugar log   Carlus Pavlov, MD PhD [facility]

## 2016-11-01 NOTE — Patient Instructions (Addendum)
Please continue: - Metformin 1000 mg 2x a day, with meals - Januvia 100 mg in am, before b'fast - Glipizide 5 mg 2x a day before b'fast and dinner  Please decrease:  - Jardiance to 12.5 mg before b'fast for 1-2 weeks and if the itching persists, stop the Jardiance.  If you have to stop Jardiance and sugars increase >> try to restart Trulicity 0.75 mg weekly.   Please return in 3 months with your sugar log.

## 2016-11-11 LAB — HM DIABETES EYE EXAM

## 2016-12-30 ENCOUNTER — Telehealth: Payer: Self-pay | Admitting: Internal Medicine

## 2016-12-30 ENCOUNTER — Other Ambulatory Visit: Payer: Self-pay

## 2016-12-30 MED ORDER — DULAGLUTIDE 0.75 MG/0.5ML ~~LOC~~ SOAJ: 0.7500 mg | SUBCUTANEOUS | 3 refills | Status: DC

## 2016-12-30 NOTE — Telephone Encounter (Signed)
Submitted

## 2016-12-30 NOTE — Telephone Encounter (Signed)
**  Remind patient they can make refill requests via MyChart**  Medication refill request (Name & Dosage): Dulaglutide (TRULICITY) 0.75 MG/0.5ML SOPN   Preferred pharmacy (Name & Address):  [id number]- [place], [place] - [address] [phone] (Phone) [phone] (Fax)      Other comments (if applicable):

## 2017-01-21 ENCOUNTER — Other Ambulatory Visit: Payer: Self-pay | Admitting: Internal Medicine

## 2017-02-06 ENCOUNTER — Telehealth: Payer: Self-pay | Admitting: Physician Assistant

## 2017-02-06 ENCOUNTER — Encounter: Payer: Self-pay | Admitting: Internal Medicine

## 2017-02-06 ENCOUNTER — Ambulatory Visit (INDEPENDENT_AMBULATORY_CARE_PROVIDER_SITE_OTHER): Payer: BC Managed Care – PPO | Admitting: Internal Medicine

## 2017-02-06 VITALS — BP 100/68 | Ht 68.0 in | Wt 212.2 lb

## 2017-02-06 DIAGNOSIS — E669 Obesity, unspecified: Secondary | ICD-10-CM | POA: Diagnosis not present

## 2017-02-06 DIAGNOSIS — E1165 Type 2 diabetes mellitus with hyperglycemia: Secondary | ICD-10-CM | POA: Diagnosis not present

## 2017-02-06 DIAGNOSIS — Z6832 Body mass index (BMI) 32.0-32.9, adult: Secondary | ICD-10-CM

## 2017-02-06 LAB — POCT GLYCOSYLATED HEMOGLOBIN (HGB A1C): Hemoglobin A1C: 6.6

## 2017-02-06 MED ORDER — DULAGLUTIDE 1.5 MG/0.5ML ~~LOC~~ SOAJ: SUBCUTANEOUS | 3 refills | Status: DC

## 2017-02-06 MED ORDER — METFORMIN HCL 1000 MG PO TABS: 1000.0000 mg | ORAL_TABLET | Freq: Two times a day (BID) | ORAL | 3 refills | Status: DC

## 2017-02-06 NOTE — Progress Notes (Signed)
Patient ID: Shelly Morgan, female   DOB: 1961/02/21, 56 y.o.   MRN: [id number]   HPI: Shelly Morgan is a 56 y.o.-year-old female, initially referred by her PCP, Manon Hilding, PA, for management of DM2, dx in 2008, GDM initially in 2002, non-insulin-dependent, uncontrolled, without long term complications. Last visit 3 mo ago. On [employer].  Since last visit, she had to come off Jardiance b/c vaginal itching >> restarted Trulicity.  Last hemoglobin A1c was: Lab Results  Component Value Date   HGBA1C 6.6 11/01/2016   HGBA1C 6.4 06/25/2016   HGBA1C 7.2 02/02/2016  01/25/2016: 7.4% 04/24/2015: HbA1c 7.9% 04/21/2014: HbA1c 6.9%  Pt was on a regimen of: - Metformin 1000 mg 2x a day - Amaryl 4 mg 2x a day before meals Tried Trulicity 0.75 mg weekly - started 10/2015 >> stopped 12/15/2015 b/c nausea, GERD Victoza was not covered.  She is now on: - Metformin ER 1000 mg 2x a day (did not move all Metformin at night - forgot) - Glipizide 5 mg 2x a day before b'fast and dinner - Trulicity restarted at 0.75 mg weekly after 11/2016 She was on Jardiance 25 mg before b'fast. - started 03/2016 - had 1 yeast inf., but continued to have labial itching >> on a steroid cream + Azo cream 3x a day >> but did not help >> stopped.  Pt checks her sugars 1-2x a day: - am: 135-175, 191 >> 141-207 >> 146-196 >> 113-204 >> 129, 156-203 - 2h after b'fast: n/c >> 82, 112-224 >> 137-203, 295 >> 127, 128 >> 71-115, 213 - before lunch: 150-173 >> 99-107 >> 139 >> 131 >> 109-130 - 2h after lunch: 149 >> 110-161 >> 116-174 >> 109, 111 >> 108-120, 231 - before dinner: 70, 110-237 >> 89, 116-154 >> 112-137, 188, 200 >> 96-181, 312 (gummy bears) - 2h after dinner: n/c >> 142 >> 119 >> 101, 103 >> 128 - bedtime: 178 >> 94-140 >> 114-217 >> 105 >> 105-140, 183 - nighttime: n/c >> 189 >. n/c No lows. Lowest sugar was 109 >> 71; she has hypoglycemia awareness at 90s.  Highest sugar was low 200s >> 312.  Glucometer:  One Touch Verio  Pt's meals are: - Breakfast: Enterex or Eggs  + 1/2 Bagel - Lunch: 1/2 PB sandwich or salad or veggies or Enterex - Dinner: chicken or pork + veggies or salad  - Snacks: 2: cottage cheese, fruit, veggies, nuts Drinks: 1 diet sodas a day (Dr Reino Kent). No coffee or tea. She has seen nutrition x3 in the past.  - No CKD, last BUN/creatinine:  Lab Results  Component Value Date   BUN 19 06/25/2016   BUN 17 04/24/2015   CREATININE 1.03 06/25/2016   CREATININE 0.9 04/24/2015   - Latest Lipids (per [facility] records): Lab Results  Component Value Date   CHOL 196 10/23/2015   HDL 147 (A) 10/23/2015   TRIG 145 10/23/2015  LDL 118 Not on a statin. - last eye exam was in 11/2016 >> No DR. - denies numbness and tingling in her feet. On ASA 81 mg daily.  ROS: Constitutional: no weight gain/no weight loss, no fatigue, no subjective hyperthermia, no subjective hypothermia Eyes: no blurry vision, no xerophthalmia ENT: no sore throat, no nodules palpated in throat, no dysphagia, no odynophagia, no hoarseness Cardiovascular: no CP/no SOB/no palpitations/no leg swelling Respiratory: no cough/no SOB/no wheezing Gastrointestinal: no N/no V/no D/no C/no acid reflux Musculoskeletal: no muscle aches/no joint aches Skin: no rashes, no hair loss Neurological: no  tremors/no numbness/no tingling/no dizziness  I reviewed pt's medications, allergies, PMH, social hx, family hx, and changes were documented in the history of present illness. Otherwise, unchanged from my initial visit note.  Past Medical History:  Diagnosis Date  . Diabetes mellitus without complication [facility](HCC)    Past Surgical History:  Procedure Laterality Date  . APPENDECTOMY  1974  . CESAREAN SECTION  2002  . OVARIAN CYST SURGERY  1974   Social History   Social History  . Marital status: Married    Spouse name: N/A  . Number of children: 1   Occupational History  . 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    Social History Main Topics  . Smoking status: Former Smoker    Types: Cigarettes    Quit date: 1997  . Smokeless tobacco: Not on file  . Alcohol use Yes     Comment: 1-2 year  . Drug use: no   Current Outpatient Prescriptions on File Prior to Visit  Medication Sig Dispense Refill  . diphenhydrAMINE (BENADRYL) 25 mg capsule Take 25 mg by mouth 2 (two) times daily.    . Dulaglutide (TRULICITY) 0.75 MG/0.5ML SOPN Inject 0.75 mg into the skin once a week. 4 pen 3  . glipiZIDE (GLUCOTROL) 5 MG tablet TAKE 1 TABLET BY MOUTH TWICE A DAY BEFORE A MEAL 180 tablet 3  . Homeopathic Products (AZO YEAST PLUS) TABS Take 3 tablets by mouth daily.    . hydrocortisone cream 1 % Apply 1 application topically 2 (two) times daily.    Marland Kitchen. JANUVIA 100 MG tablet TAKE 1 TABLET BY MOUTH EVERY DAY 30 tablet 5  . JARDIANCE 25 MG TABS tablet TAKE 1 TABLET BY MOUTH EVERY DAY 30 tablet 5  . metFORMIN (GLUCOPHAGE) 1000 MG tablet Take 1,000 mg by mouth 2 (two) times daily with a meal.    . ONETOUCH DELICA LANCETS 33G MISC daily. use as directed  3  . ONETOUCH VERIO test strip      Current Facility-Administered Medications on File Prior to Visit  Medication Dose Route Frequency Provider Last Rate Last Dose  . aspirin EC tablet 81 mg  81 mg Oral Daily Carlus PavlovGherghe, Khristian Seals, MD       Allergies  Allergen Reactions  . Codeine Nausea And Vomiting  . Morphine And Related Nausea And Vomiting   Family History  Problem Relation Age of Onset  . COPD Mother   . Diverticulitis Father   . Crohn's disease Brother    PE: BP 100/68   Ht 5\' 8"  (1.727 m)   Wt 212 lb 3.2 oz (96.3 kg)   BMI 32.26 kg/m  Wt Readings from Last 3 Encounters:  02/06/17 212 lb 3.2 oz (96.3 kg)  11/01/16 208 lb 12.8 oz (94.7 kg)  06/25/16 207 lb (93.9 kg)   Constitutional: overweight, in NAD Eyes: PERRLA, EOMI, no exophthalmos ENT: moist mucous membranes, no  thyromegaly, no cervical lymphadenopathy Cardiovascular: RRR, No MRG Respiratory: CTA B Gastrointestinal: abdomen soft, NT, ND, BS+ Musculoskeletal: no deformities, strength intact in all 4 Skin: moist, warm, no rashes Neurological: no tremor with outstretched hands, DTR normal in all 4  ASSESSMENT: 1. DM2, non-insulin-dependent, uncontrolled, without long term complications, but with hyperglycemia  2. Obesity class 1 BMI Classification:  < 18.5 underweight   18.5-24.9 normal weight   25.0-29.9 overweight   30.0-34.9 class I obesity   35.0-39.9 class II obesity   ?  40.0 class III obesity   PLAN:  1. Patient with long-standing, uncontrolled diabetes, with improved control after adding Jardiance, however, she could not continue with it due to vaginal itching despite using medicated creams. She stopped Jardiance 3 months ago and she restarted Trulicity 0.75 mg weekly. She continues on this dose now. Her sugars in a.m. are high, not at target and they improve later in the day unless she has dietary indiscretions, when the sugars can increase to 200s and even 300s. - As the sugars in a.m. are high, will move all metformin at night, which he has been doing in the past, but she cannot remember why she switched back to 1000 mg twice a day. - We will also increase Trulicity to target dose, of 1.5 mg weekly. - I suggested to:  Patient Instructions  Please move all Metformin at dinnertime (2000 mg).  Increase Trulicity to 1.5 mg weekly.  Please continue Glipizide 5 mg 2x a day before meals.  Please return in 3 months with your sugar log.   - today, HbA1c is 6.6% (stable) - continue checking sugars at different times of the day - check 1x a day, rotating checks - advised for yearly eye exams >> she is UTD - need to check a Lipid panel ut she mentions she had one at PCPs office - Return to clinic in 3 mo with sugar log    2. Obesity class 1 - gained 4 lbs since last visit - advised  her to buy a scale to have at home - will also increase Trulicity which should help  Carlus Pavlov, MD PhD [facility]

## 2017-02-06 NOTE — Patient Instructions (Addendum)
Please move all Metformin at dinnertime (2000 mg).  Increase Trulicity to 1.5 mg weekly.  Please continue Glipizide 5 mg 2x a day before meals.  Please return in 3 months with your sugar log.

## 2017-02-06 NOTE — Telephone Encounter (Signed)
error 

## 2017-05-08 ENCOUNTER — Ambulatory Visit: Payer: BC Managed Care – PPO | Admitting: Internal Medicine

## 2017-05-14 ENCOUNTER — Other Ambulatory Visit: Payer: Self-pay | Admitting: Family Medicine

## 2017-05-14 ENCOUNTER — Other Ambulatory Visit (HOSPITAL_COMMUNITY)
Admission: RE | Admit: 2017-05-14 | Discharge: 2017-05-14 | Disposition: A | Discharge status: Home / Self Care | Payer: BC Managed Care – PPO | Source: Ambulatory Visit | Attending: Family Medicine | Admitting: Family Medicine

## 2017-05-14 DIAGNOSIS — Z124 Encounter for screening for malignant neoplasm of cervix: Secondary | ICD-10-CM | POA: Insufficient documentation

## 2017-05-15 ENCOUNTER — Encounter: Payer: Self-pay | Admitting: Internal Medicine

## 2017-05-16 LAB — CYTOLOGY - PAP
Diagnosis: NEGATIVE
HPV: NOT DETECTED

## 2017-05-28 ENCOUNTER — Encounter: Payer: Self-pay | Admitting: Internal Medicine

## 2017-05-28 ENCOUNTER — Ambulatory Visit: Payer: BC Managed Care – PPO | Admitting: Internal Medicine

## 2017-05-28 VITALS — BP 108/70 | HR 84 | Ht 68.0 in | Wt 215.2 lb

## 2017-05-28 DIAGNOSIS — Z6832 Body mass index (BMI) 32.0-32.9, adult: Secondary | ICD-10-CM | POA: Diagnosis not present

## 2017-05-28 DIAGNOSIS — E669 Obesity, unspecified: Secondary | ICD-10-CM

## 2017-05-28 DIAGNOSIS — E1165 Type 2 diabetes mellitus with hyperglycemia: Secondary | ICD-10-CM | POA: Diagnosis not present

## 2017-05-28 LAB — POCT GLYCOSYLATED HEMOGLOBIN (HGB A1C): Hemoglobin A1C: 6.7

## 2017-05-28 MED ORDER — FREESTYLE LIBRE 14 DAY READER DEVI: 1.0000 | Freq: Once | 1 refills | Status: AC

## 2017-05-28 MED ORDER — FREESTYLE LIBRE 14 DAY SENSOR MISC: 1.0000 | 11 refills | Status: DC

## 2017-05-28 NOTE — Progress Notes (Addendum)
Patient ID: Shelly Morgan, female   DOB: 1960-10-20, 56 y.o.   MRN: [id number]   HPI: Shelly Morgan is a 56 y.o.-year-old female, initially referred by her PCP, Manon Hilding, PA, for management of DM2, dx in 2008, GDM initially in 2002, non-insulin-dependent, uncontrolled, without long term complications. Last visit 3.5 mo ago. On [employer].  Last hemoglobin A1c was: Lab Results  Component Value Date   HGBA1C 6.6 02/06/2017   HGBA1C 6.6 11/01/2016   HGBA1C 6.4 06/25/2016  01/25/2016: 7.4% 04/24/2015: HbA1c 7.9% 04/21/2014: HbA1c 6.9%  She is on: - Metformin ER  2000 mg with dinner - Glipizide 5 mg 2x a day before b'fast and dinner - Trulicity restarted at 0.75 mg weekly after 11/2016 >> 1.5 mg weekly 02/2017 She was on Jardiance 25 mg before b'fast. - started 03/2016 - had 1 yeast inf., but continued to have labial itching >> on a steroid cream + Azo cream 3x a day >> but did not help >> stopped. She tried Trulicity before (2017) and developed nausea and GERD. Victoza was not covered.  Pt checks her sugars 1-2 times a day: - am: 146-196 >> 113-204 >> 129, 156-203 >> 123-199, 202 - 2h after b'fast: 127, 128 >> 71-115, 213  >> 103-121 - before lunch: 139 >> 131 >> 109-130 >> 107-118 - 2h after lunch: 109, 111 >> 108-120, 231 >> 100-134 - before dinner:112-137, 188, 200 >> 96-181, 312 >> 109-122 - 2h after dinner: 142 >> 119 >> 101, 103 >> 128 >> 127-150 - bedtime: 114-217 >> 105 >> 105-140, 183 >> 107-109 - nighttime: n/c >> 189 >. N/c >> 135 Lowest sugar was 109 >> 71 >> 123; she has hypoglycemia awareness in the 90s.  Highest sugar was low 200s >> 312 >> 202.  Glucometer: One Touch Verio  Pt's meals are: - Breakfast: Enterex or Eggs  + 1/2 Bagel - Lunch: 1/2 PB sandwich or salad or veggies or Enterex - Dinner: chicken or pork + veggies or salad  - Snacks: 2: cottage cheese, fruit, veggies, nuts Drinks: 1 diet sodas a day (Dr Reino Kent). No coffee or tea. She has seen  nutrition 3 times in the past.  - No CKD, last BUN/creatinine:  Lab Results  Component Value Date   BUN 19 06/25/2016   BUN 17 04/24/2015   CREATININE 1.03 06/25/2016   CREATININE 0.9 04/24/2015   -+ HL; latest Lipids  2 weeks ago >> will ask for records (per [facility] records): Lab Results  Component Value Date   CHOL 196 10/23/2015   HDL 147 (A) 10/23/2015   TRIG 145 10/23/2015  LDL 118 Not on a statin - last eye exam was in 11/2016: No DR -Denies numbness and tingling in her feet. On ASA 81 mg daily.  ROS: Constitutional: no weight gain/no weight loss, no fatigue, no subjective hyperthermia, no subjective hypothermia Eyes: no blurry vision, no xerophthalmia ENT: no sore throat, no nodules palpated in throat, no dysphagia, no odynophagia, no hoarseness Cardiovascular: no CP/no SOB/no palpitations/no leg swelling Respiratory: no cough/no SOB/no wheezing Gastrointestinal: no N/no V/no D/no C/no acid reflux Musculoskeletal: no muscle aches/no joint aches Skin: no rashes, no hair loss Neurological: no tremors/no numbness/no tingling/no dizziness  I reviewed pt's medications, allergies, PMH, social hx, family hx, and changes were documented in the history of present illness. Otherwise, unchanged from my initial visit note.  Past Medical History:  Diagnosis Date  . Diabetes mellitus without complication [facility])    Social History   Social History  .  Marital status: Married    Spouse name: N/A  . Number of children: 1   Occupational History  . 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    Social History Main Topics  . Smoking status: Former Smoker    Types: Cigarettes    Quit date: 1997  . Smokeless tobacco: Not on file  . Alcohol use Yes     Comment: 1-2 year  . Drug use: no   Current Outpatient Medications on File Prior to Visit  Medication Sig Dispense Refill  . diphenhydrAMINE  (BENADRYL) 25 mg capsule Take 25 mg by mouth 2 (two) times daily.    . Dulaglutide (TRULICITY) 1.5 MG/0.5ML SOPN Inject 1.5 mg under skin weekly 12 pen 3  . glipiZIDE (GLUCOTROL) 5 MG tablet TAKE 1 TABLET BY MOUTH TWICE A DAY BEFORE A MEAL 180 tablet 3  . metFORMIN (GLUCOPHAGE) 1000 MG tablet Take 1 tablet (1,000 mg total) by mouth 2 (two) times daily with a meal. 180 tablet 3  . ONETOUCH DELICA LANCETS 33G MISC daily. use as directed  3  . ONETOUCH VERIO test strip      No current facility-administered medications on file prior to visit.    Allergies  Allergen Reactions  . Codeine Nausea And Vomiting  . Morphine And Related Nausea And Vomiting   Family History  Problem Relation Age of Onset  . COPD Mother   . Diverticulitis Father   . Crohn's disease Brother    PE: BP 108/70   Pulse 84   Ht 5\' 8"  (1.727 m)   Wt 215 lb 3.2 oz (97.6 kg)   SpO2 96%   BMI 32.72 kg/m  Wt Readings from Last 3 Encounters:  05/28/17 215 lb 3.2 oz (97.6 kg)  02/06/17 212 lb 3.2 oz (96.3 kg)  11/01/16 208 lb 12.8 oz (94.7 kg)   Constitutional: overweight, in NAD Eyes: PERRLA, EOMI, no exophthalmos ENT: moist mucous membranes, no thyromegaly, no cervical lymphadenopathy Cardiovascular: RRR, No MRG Respiratory: CTA B Gastrointestinal: abdomen soft, NT, ND, BS+ Musculoskeletal: no deformities, strength intact in all 4 Skin: moist, warm, no rashes Neurological: no tremor with outstretched hands, DTR normal in all 4  ASSESSMENT: 1. DM2, non-insulin-dependent, uncontrolled, without long term complications, but with hyperglycemia  2. Obesity class 1 BMI Classification:  < 18.5 underweight   18.5-24.9 normal weight   25.0-29.9 overweight   30.0-34.9 class I obesity   35.0-39.9 class II obesity   ? 40.0 class III obesity   PLAN:  1. Patient with long-standing, uncontrolled, diabetes, with improved control after adding Jardiance, however, we could not continue due to vaginal itching despite  using medicated creams.  We started Trulicity summer 2018 and we increased the dose to 1.5 mg weekly 02/2017 as she still had a high sugars in the 200s or even 300s with dietary indiscretions.  I also advised her to move all the metformin dose at night. - sugars are at goal later in the day but still quite high in am - discussed dawn phenomenon and discussed ways to improve her insulin resistance which should help her improve am sugars >> start exercising, cut out fatty foods, no eating after 7 pm, etc. OTW, no change in her regimen is needed. - she is interested in a [employer] libre >> called in - I suggested to:  Patient Instructions  Please continue: - Metformin 2000 mg with dinner  - Glipizide 5 mg  twice a day before meals - Trulicity 1.5 mg weekly  Please return in 3 months with your sugar log.   - today, HbA1c is 6.7% (slightly higher) - continue checking sugars at different times of the day - check 1x a day, rotating checks - advised for yearly eye exams >> she is UTD - Return to clinic in 3 mo with sugar log    2. Obesity class 1 - weight increased during the Holidays - We will continue Trulicity - discussed the need to continue to work on diet >> cut down fatty foods and to start exercise  Addendum (06/06/2017): Received labs from PCP, drawn on 05/15/2017: Glucose 171, BUN/creatinine 20/1.01, GFR 56, otherwise normal.  Carlus Pavlovristina Zylie Mumaw, MD PhD [facility]

## 2017-05-28 NOTE — Patient Instructions (Signed)
Please continue: - Metformin 2000 mg with dinner  - Glipizide 5 mg twice a day before meals - Trulicity 1.5 mg weekly  Please return in 3 months with your sugar log.

## 2017-05-28 NOTE — Addendum Note (Signed)
Addended by: Devinn Hurwitz on: 05/28/2017 10:54 AM   Modules accepted: Orders  

## 2017-06-02 ENCOUNTER — Telehealth: Payer: Self-pay

## 2017-06-02 NOTE — Telephone Encounter (Signed)
Pharmacy sent letter asking to switch patient to the 10 day reader and sensor since the 14 day Josephine Igolibre is on back order. The 10 day has been discontinued so that patient would have to go back to the 14 day. Is this okay to do?

## 2017-06-04 MED ORDER — FREESTYLE LIBRE READER DEVI: 1.0000 | Freq: Once | 0 refills | Status: AC

## 2017-06-04 MED ORDER — FREESTYLE LIBRE SENSOR SYSTEM MISC: 2 refills | Status: DC

## 2017-06-04 NOTE — Telephone Encounter (Signed)
Sent!

## 2017-06-04 NOTE — Telephone Encounter (Signed)
Yes, whichever of the 2 they can use, it is fine. This would be true for all Freestyle CGMs as I anticipate quite a few calls about this.

## 2017-06-11 ENCOUNTER — Encounter: Payer: Self-pay | Admitting: Internal Medicine

## 2017-06-11 NOTE — Progress Notes (Signed)
Received labs from PCP, drawn on 05/15/2017: Lipids: 189/202/47/102 - she received diet advised and also recommended to start atorvastatin 20 mg daily CMP normal, except glucose 171.  BUN/creatinine 20/1.01, GFR 56.

## 2017-08-27 ENCOUNTER — Ambulatory Visit: Payer: BC Managed Care – PPO | Admitting: Internal Medicine

## 2017-09-01 ENCOUNTER — Ambulatory Visit: Payer: BC Managed Care – PPO | Admitting: Internal Medicine

## 2017-09-01 ENCOUNTER — Telehealth: Payer: Self-pay | Admitting: Internal Medicine

## 2017-09-01 ENCOUNTER — Encounter: Payer: Self-pay | Admitting: Internal Medicine

## 2017-09-01 VITALS — BP 112/68 | HR 81 | Ht 68.0 in | Wt 217.4 lb

## 2017-09-01 DIAGNOSIS — E1165 Type 2 diabetes mellitus with hyperglycemia: Secondary | ICD-10-CM

## 2017-09-01 DIAGNOSIS — E669 Obesity, unspecified: Secondary | ICD-10-CM

## 2017-09-01 DIAGNOSIS — Z6832 Body mass index (BMI) 32.0-32.9, adult: Secondary | ICD-10-CM

## 2017-09-01 DIAGNOSIS — E785 Hyperlipidemia, unspecified: Secondary | ICD-10-CM

## 2017-09-01 LAB — POCT GLUCOSE (DEVICE FOR HOME USE): POC Glucose: 186 mg/dl — AB (ref 70–99)

## 2017-09-01 LAB — POCT GLYCOSYLATED HEMOGLOBIN (HGB A1C): Hemoglobin A1C: 6.8

## 2017-09-01 MED ORDER — INSULIN NPH (HUMAN) (ISOPHANE) 100 UNIT/ML ~~LOC~~ SUSP
SUBCUTANEOUS | 11 refills | Status: DC
Start: 2017-09-01 — End: 2017-09-10

## 2017-09-01 MED ORDER — INSULIN PEN NEEDLE 32G X 4 MM MISC: 3 refills | Status: DC

## 2017-09-01 NOTE — Patient Instructions (Addendum)
Please continue: - Metformin 2000 mg with dinner - Glipizide 5 mg 2x a day before meals - Trulicity 1.5 mg weekly  Please reduce dietary fat for the next 1-2 weeks.  If sugars not better, start NPH 6 units at bedtime and increase by 2 units every 2 days until am sugars <130.  Please stop at the lab.  Please return in 3 months with your sugar log.

## 2017-09-01 NOTE — Progress Notes (Signed)
Patient ID: Shelly Morgan Seda, female   DOB: [birth year], 57 y.o.   MRN: [id number]   HPI: Shelly Morgan Broaden is a 57 y.o.-year-old female, initially referred by her PCP, Manon HildingJessica Mauney, PA, for management of DM2, dx in 2008, GDM initially in 2002, non-insulin-dependent, uncontrolled, without long term complications. Last visit 3 mo ago.  Last hemoglobin A1c was: Lab Results  Component Value Date   HGBA1C 6.7 05/28/2017   HGBA1C 6.6 02/06/2017   HGBA1C 6.6 11/01/2016  01/25/2016: 7.4% 04/24/2015: HbA1c 7.9% 04/21/2014: HbA1c 6.9%  She is on: - Metformin ER  2000 mg with dinner - Glipizide 5 mg 2x a day before b'fast and dinner - Trulicity restarted at 0.75 mg weekly after 11/2016 >> 1.5 mg weekly since 02/2017 She was on Jardiance 25 mg before b'fast. - started 03/2016 - had 1 yeast inf., but continued to have labial itching >> on a steroid cream + Azo cream 3x a day >> but this did not help so she stopped Jardiance. She tried Trulicity before (2017) and developed nausea and GERD. Victoza was not covered.  Pt checks her sugars 1-2 times a day: - am: 129, 156-203 >> 123-199, 202 >> 153-208, 223 (highest value when she was sick) - 2h after b'fast: 71-115, 213  >> 103-121 >>  97-178, 210, 233 - before lunch: 109-130 >> 107-118 >> 92-117 - 2h after lunch:108-120, 231 >> 100-134 >> 125-135 - before dinner: 96-181, 312 >> 109-122 >> 107-138 - 2h after dinner: 128 >> 127-150 >> 138  - bedtime:105-140, 183 >> 107-109 >> 105 - nighttime: n/c >> 189 >. N/c >> 135 >> n/c Lowest sugar was 123 >> 92; she has hypoglycemia awareness in the 90s. Highest sugar was 202 >> 233.  Glucometer: One Touch Verio  Pt's meals are: - Breakfast: Enterex or Eggs  + 1/2 Bagel >> smoothie - snack: almonds - Lunch: 1/2 PB sandwich or salad or veggies or Enterex >> 2 hard boiled eggs + veggies + fruit cup - Dinner: chicken or pork + veggies or salad  >> chicken + salad  - Snacks: 2: cottage cheese, fruit, veggies,  nuts >> occas. Almonds, veggies Drinks: 1 diet sodas a day (Dr Reino KentPepper). No coffee or tea. She has seen nutrition several times in the past.  - No CKD, last BUN/creatinine:  Received labs from PCP, drawn on 05/15/2017: CMP normal, except glucose 171.  BUN/creatinine 20/1.01, GFR 56. Lab Results  Component Value Date   BUN 19 06/25/2016   BUN 17 04/24/2015   CREATININE 1.03 06/25/2016   CREATININE 0.9 04/24/2015   -+ HL; latest Lipids  Received labs from PCP, drawn on 05/15/2017: Lipids: 189/202/47/102  Lab Results  Component Value Date   CHOL 196 10/23/2015   HDL 147 (A) 10/23/2015   TRIG 145 10/23/2015  Started atorvastatin 20 mg daily - last eye exam was in 11/2016: No DR -Denies numbness and tingling in her feet. On ASA 81.  ROS: Constitutional: no weight gain/no weight loss, no fatigue, no subjective hyperthermia, no subjective hypothermia Eyes: no blurry vision, no xerophthalmia ENT: no sore throat, no nodules palpated in throat, no dysphagia, no odynophagia, no hoarseness Cardiovascular: no CP/no SOB/no palpitations/no leg swelling Respiratory: no cough/no SOB/no wheezing Gastrointestinal: + Occasional N/no V/no D/no C/no acid reflux Musculoskeletal: no muscle aches/no joint aches Skin: no rashes, no hair loss Neurological: no tremors/no numbness/no tingling/no dizziness, + occasional headaches  I reviewed pt's medications, allergies, PMH, social hx, family hx, and changes were documented in the  history of present illness. Otherwise, unchanged from my initial visit note.  Past Medical History:  Diagnosis Date  . Diabetes mellitus without complication [facility])    Social History   Social History  . Marital status: Married    Spouse name: N/A  . Number of children: 1   Occupational History  . 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    Social History Main Topics  . Smoking status:  Former Smoker    Types: Cigarettes    Quit date: 1997  . Smokeless tobacco: Not on file  . Alcohol use Yes     Comment: 1-2 year  . Drug use: no   Current Outpatient Medications on File Prior to Visit  Medication Sig Dispense Refill  . Continuous Blood Gluc Sensor (FREESTYLE LIBRE 14 DAY SENSOR) MISC 1 each by Does not apply route every 14 (fourteen) days. Change every 2 weeks 2 each 11  . Continuous Blood Gluc Sensor (FREESTYLE LIBRE SENSOR SYSTEM) MISC Use 10 day sensor in place of 14 day to check sugars 4 each 2  . diphenhydrAMINE (BENADRYL) 25 mg capsule Take 25 mg by mouth 2 (two) times daily.    . Dulaglutide (TRULICITY) 1.5 MG/0.5ML SOPN Inject 1.5 mg under skin weekly 12 pen 3  . glipiZIDE (GLUCOTROL) 5 MG tablet TAKE 1 TABLET BY MOUTH TWICE A DAY BEFORE A MEAL 180 tablet 3  . metFORMIN (GLUCOPHAGE) 1000 MG tablet Take 1 tablet (1,000 mg total) by mouth 2 (two) times daily with a meal. 180 tablet 3  . ONETOUCH DELICA LANCETS 33G MISC daily. use as directed  3  . ONETOUCH VERIO test strip      No current facility-administered medications on file prior to visit.    Allergies  Allergen Reactions  . Codeine Nausea And Vomiting  . Morphine And Related Nausea And Vomiting   Family History  Problem Relation Age of Onset  . COPD Mother   . Diverticulitis Father   . Crohn's disease Brother    PGF and F with DM.  PE: BP 112/68   Pulse 81   Ht 5\' 8"  (1.727 m)   Wt 217 lb 6.4 oz (98.6 kg)   SpO2 99%   BMI 33.06 kg/m  Wt Readings from Last 3 Encounters:  09/01/17 217 lb 6.4 oz (98.6 kg)  05/28/17 215 lb 3.2 oz (97.6 kg)  02/06/17 212 lb 3.2 oz (96.3 kg)   Constitutional: overweight, in NAD Eyes: PERRLA, EOMI, no exophthalmos ENT: moist mucous membranes, no thyromegaly, no cervical lymphadenopathy Cardiovascular: RRR, No MRG Respiratory: CTA B Gastrointestinal: abdomen soft, NT, ND, BS+ Musculoskeletal: no deformities, strength intact in all 4 Skin: moist, warm, no  rashes Neurological: no tremor with outstretched hands, DTR normal in all 4  ASSESSMENT: 1. DM2, non-insulin-dependent, uncontrolled, without long term complications, but with hyperglycemia  2. Obesity class 1 BMI Classification:  < 18.5 underweight   18.5-24.9 normal weight   25.0-29.9 overweight   30.0-34.9 class I obesity   35.0-39.9 class II obesity   ? 40.0 class III obesity   3. HL  PLAN:  1. Patient with previously long-standing, uncontrolled, type 2 diabetes, on oral medication regimen and GLP-1 receptor agonist.  Before last visit we try to add Jardiance, however, she could not tolerate this due to vaginal itching despite using medicated creams.  We started Trulicity last summer and we increased to 1.5 mg weekly in 02/2017.  We also moved her metformin at night at that time.  At last visit, sugars were at goal later in the day but were still quite high in the morning.  We did discuss about dawn phenomenon and also about ways to improve her insulin resistance: Start exercising, cut out fatty foods, no eating after 7 PM - At this visit, her sugars in a.m. are still high and actually are higher despite moving dinner earlier.  Sugars improve as the day goes by,   After she takes her glipizide and also while on Trulicity.  We already move the entire dose of metformin with dinner.  At this visit, I suggested that she takes intermediate acting insulin, NPH, at bedtime to improve sugars in the morning.  We do not need a 24-hour insulin for her since sugars during the day are at goal. - In the meantime, I would like to check her for type 1 diabetes by checking pancreatic antibodies and insulin production.  CBG in the office 184. Orders Placed This Encounter  Procedures  . C-peptide  . Anti-islet cell antibody  . Glutamic acid decarboxylase auto abs  . Glucose, fasting  . ZNT8 Antibodies  . POCT glycosylated hemoglobin (Hb A1C)  - I suggested to:  Patient Instructions  Please  continue: - Metformin 2000 mg with dinner - Glipizide 5 mg 2x a day before meals - Trulicity 1.5 mg weekly  Please reduce dietary fat for the next 1-2 weeks.  If sugars not better, start NPH 6 units at bedtime and increase by 2 units every 2 days until am sugars <130.  Please stop at the lab.  Please return in 3 months with your sugar log.   - today, HbA1c is 6.8% (slightly higher) - continue checking sugars at different times of the day - check 1-2x a day, rotating checks - advised for yearly eye exams >> she is UTD - Return to clinic in 4 mo with sugar log     2. Obesity class 1 -Stable -We will continue Trulicity which also helps with weight gain - Again discussed the need to continue to work on diet: Cut down fatty foods  3. HL -Reviewed latest lipid panel obtained by PCP in 05/2017: LDL above goal, as are triglycerides -She was started on atorvastatin 20 mg daily, which she tolerates well.  Component     Latest Ref Rng & Units 09/01/2017  Hemoglobin A1C      6.8  C-Peptide     0.80 - 3.85 ng/mL 2.87  Islet Cell Ab     Neg:<1:1 Negative  Glutamic Acid Decarb Ab     <5 IU/mL <5  Glucose, Plasma     65 - 99 mg/dL 161 (H)  ZNT8 Antibodies     U/mL <15   Labs negative for DM1.   Carlus Pavlov, MD PhD [facility]

## 2017-09-01 NOTE — Telephone Encounter (Signed)
error 

## 2017-09-06 LAB — GLUCOSE, FASTING: Glucose, Plasma: 172 mg/dL — ABNORMAL HIGH (ref 65–99)

## 2017-09-06 LAB — GLUTAMIC ACID DECARBOXYLASE AUTO ABS: Glutamic Acid Decarb Ab: <5 IU/mL (ref <5)

## 2017-09-06 LAB — C-PEPTIDE: C-Peptide: 2.87 ng/mL (ref 0.80–3.85)

## 2017-09-08 ENCOUNTER — Telehealth: Payer: Self-pay | Admitting: Internal Medicine

## 2017-09-08 NOTE — Telephone Encounter (Signed)
Patient state her pharmacy haven't received the request for insulin, she said it was new prescription and she did not know the name of it.  [id number] [place]- [place],  - [address] DEA #:  [id number]

## 2017-09-10 ENCOUNTER — Telehealth: Payer: Self-pay | Admitting: Internal Medicine

## 2017-09-10 LAB — ZNT8 ANTIBODIES: ZNT8 Antibodies: <15 U/mL

## 2017-09-10 LAB — ANTI-ISLET CELL ANTIBODY: Islet Cell Ab: NEGATIVE

## 2017-09-10 MED ORDER — INSULIN NPH (HUMAN) (ISOPHANE) 100 UNIT/ML ~~LOC~~ SUSP: SUBCUTANEOUS | 11 refills | Status: DC

## 2017-09-10 NOTE — Telephone Encounter (Signed)
Patient stated that the insulin is not covered by insurance and they have denied this.   She states the pharmacy has faxed over paperwork for this on the 1st and 2nd    insulin NPH Human (HUMULIN N) 100 UNIT/ML injection

## 2017-09-10 NOTE — Telephone Encounter (Signed)
Humulin Rx sent today.  Called pt. No answer.

## 2017-09-11 NOTE — Telephone Encounter (Signed)
[facility], this is without prescription and $28 per vial.  Would she agree to do this?

## 2017-09-12 NOTE — Telephone Encounter (Signed)
Great! They will need to run it without insurance.

## 2017-09-12 NOTE — Telephone Encounter (Signed)
Ok. Informed patient.

## 2017-09-12 NOTE — Telephone Encounter (Signed)
Spoke with patient. She does agree to do so.

## 2017-09-15 ENCOUNTER — Telehealth: Payer: Self-pay

## 2017-09-15 NOTE — Telephone Encounter (Signed)
Spoke to patient. Gave results. Pt verbalized understanding.  Pt will get insulin from [facility].

## 2017-09-15 NOTE — Telephone Encounter (Signed)
-----   Message from Shelly Pavlovristina Gherghe, MD sent at 09/10/2017  6:12 AM EDT ----- Tad Mooreasandra, can you please call pt: All labs are now back - good news: they are negative for type 1 diabetes! I saw the prev. msg about her insulin >> I re-sent it this aM.

## 2017-09-18 ENCOUNTER — Encounter: Payer: Self-pay | Admitting: Internal Medicine

## 2017-09-18 ENCOUNTER — Telehealth: Payer: Self-pay | Admitting: Internal Medicine

## 2017-09-18 NOTE — Telephone Encounter (Signed)
Patient is needing to talk with the nurse, about her insulin Novolin, she stated that she can not use the vials, but the pen needles. Please advise

## 2017-09-22 NOTE — Telephone Encounter (Signed)
Called pt. She states she is unable to give herself and injection. Requesting pens.  Unable to get Humulin pen per previous note. Asked pt to contact insurance to see what is covered. Pt request Rx sent for different pen in meantime.

## 2017-09-22 NOTE — Telephone Encounter (Signed)
This encounter was created in error - please disregard.

## 2017-09-22 NOTE — Telephone Encounter (Signed)
Spoke to patient. Gave instructions per Dr. Timoteo ExposeG's previous note. Pt will contact insurance to see what long acting insulin is covered, then call back.

## 2017-09-22 NOTE — Telephone Encounter (Signed)
We can use NPH in a pen form >> she can check with her insurance about the cost. Or we can use whichever long acting insulin covered.

## 2017-09-29 ENCOUNTER — Other Ambulatory Visit: Payer: Self-pay | Admitting: Internal Medicine

## 2017-09-29 NOTE — Telephone Encounter (Signed)
See above

## 2017-09-29 NOTE — Telephone Encounter (Signed)
Novolin preferred per insurance. Spouse called but did not find out if NPH was covered. Patient requesting NPH pen form sent to pharmacy.

## 2017-09-29 NOTE — Telephone Encounter (Signed)
Only Humulin N comes in a pen.  She could use cartridges for Novolin and that attach to a pen-like device.  Otherwise, it is in a vial.

## 2017-09-30 ENCOUNTER — Other Ambulatory Visit: Payer: Self-pay | Admitting: Internal Medicine

## 2017-09-30 MED ORDER — INSULIN NPH (HUMAN) (ISOPHANE) 100 UNIT/ML ~~LOC~~ SUSP: SUBCUTANEOUS | 3 refills | Status: DC

## 2017-09-30 NOTE — Telephone Encounter (Signed)
Spoke to patient and she agrees to use pen-like attachment for Novolin.

## 2017-09-30 NOTE — Telephone Encounter (Signed)
I will try to send Novolin N in a pen to the pharmacy and see what they say.Marland KitchenMarland Kitchen

## 2017-10-01 NOTE — Telephone Encounter (Signed)
Ok and thanks!

## 2017-10-02 ENCOUNTER — Other Ambulatory Visit: Payer: Self-pay

## 2017-10-02 MED ORDER — INSULIN NPH (HUMAN) (ISOPHANE) 100 UNIT/ML ~~LOC~~ SUSP: SUBCUTANEOUS | 3 refills | Status: DC

## 2017-10-04 ENCOUNTER — Other Ambulatory Visit: Payer: Self-pay | Admitting: Endocrinology

## 2017-10-04 NOTE — Telephone Encounter (Signed)
Please address

## 2017-10-06 NOTE — Telephone Encounter (Signed)
Please advise 

## 2017-10-07 NOTE — Telephone Encounter (Signed)
Dr. Elvera Lennox Rx'd Novolin N pen-form on 05/02.  Thanks.

## 2017-10-13 ENCOUNTER — Telehealth: Payer: Self-pay | Admitting: Emergency Medicine

## 2017-10-13 NOTE — Telephone Encounter (Signed)
Pt called and stated the Nova insulin prescription it doesn't come in the pen. It only come with a syringe and vial. Please give her a call back thanks.

## 2017-10-14 ENCOUNTER — Other Ambulatory Visit: Payer: Self-pay | Admitting: Internal Medicine

## 2017-10-14 MED ORDER — INSULIN GLARGINE 100 UNIT/ML SOLOSTAR PEN: 6.0000 [IU] | PEN_INJECTOR | Freq: Every day | SUBCUTANEOUS | 5 refills | Status: DC

## 2017-10-14 NOTE — Telephone Encounter (Signed)
OK, let's call in Lantus solostar 6 units at bedtime, #5 pens with 5 refills. This is a 24h insulin, but we can work with it.

## 2017-10-14 NOTE — Telephone Encounter (Signed)
Spoke to patient. Gave med instructions. Pt verbalized understanding. Sent Rx to pharmacy confirmed on file.

## 2017-10-16 ENCOUNTER — Other Ambulatory Visit: Payer: Self-pay

## 2017-10-16 MED ORDER — INSULIN DEGLUDEC 100 UNIT/ML ~~LOC~~ SOPN: PEN_INJECTOR | SUBCUTANEOUS | 2 refills | Status: DC

## 2017-10-16 NOTE — Telephone Encounter (Signed)
Can switch to [place] U100 insulin same dose.

## 2017-10-16 NOTE — Telephone Encounter (Signed)
PA or switch medication please advise

## 2017-10-16 NOTE — Telephone Encounter (Signed)
This has been done.

## 2017-11-10 ENCOUNTER — Telehealth: Payer: Self-pay | Admitting: Internal Medicine

## 2017-11-10 MED ORDER — ONETOUCH VERIO VI STRP: ORAL_STRIP | 3 refills | Status: DC

## 2017-11-10 MED ORDER — ONETOUCH DELICA LANCETS 33G MISC: 3 refills | Status: DC

## 2017-11-10 NOTE — Telephone Encounter (Signed)
Patient needs two prescription refills sent into the pharmacy  She stated she has reached out to pharmacy to have this faxed over and that she needed to contact our office for these,.   ONETOUCH DELICA LANCETS 33G MISC     ONETOUCH VERIO test strip      [id number] [place], [place] - [address]

## 2017-11-10 NOTE — Telephone Encounter (Signed)
Sent, nothing has been received from her pharmacy

## 2017-11-13 LAB — HM DIABETES EYE EXAM

## 2017-12-02 ENCOUNTER — Ambulatory Visit: Payer: BC Managed Care – PPO | Admitting: Internal Medicine

## 2017-12-02 ENCOUNTER — Encounter: Payer: Self-pay | Admitting: Internal Medicine

## 2017-12-02 VITALS — BP 120/78 | HR 85 | Ht 68.0 in | Wt 219.6 lb

## 2017-12-02 DIAGNOSIS — Z6832 Body mass index (BMI) 32.0-32.9, adult: Secondary | ICD-10-CM | POA: Diagnosis not present

## 2017-12-02 DIAGNOSIS — E669 Obesity, unspecified: Secondary | ICD-10-CM

## 2017-12-02 DIAGNOSIS — E785 Hyperlipidemia, unspecified: Secondary | ICD-10-CM

## 2017-12-02 DIAGNOSIS — E1165 Type 2 diabetes mellitus with hyperglycemia: Secondary | ICD-10-CM | POA: Diagnosis not present

## 2017-12-02 LAB — POCT GLYCOSYLATED HEMOGLOBIN (HGB A1C): Hemoglobin A1C: 7.4 % — AB (ref 4.0–5.6)

## 2017-12-02 MED ORDER — INSULIN DEGLUDEC 100 UNIT/ML ~~LOC~~ SOPN: PEN_INJECTOR | SUBCUTANEOUS | 4 refills | Status: DC

## 2017-12-02 NOTE — Progress Notes (Signed)
Patient ID: Shelly Morgan, female   DOB: 10-05-1960, 57 y.o.   MRN: [id number]   HPI: Shelly Morgan is a 57 y.o.-year-old female, initially referred by her PCP, Manon Hilding, PA, for management of DM2, dx in 2008, GDM initially in 2002, non-insulin-dependent, uncontrolled, without long term complications. Last visit 3 mo ago.  Last hemoglobin A1c was: Lab Results  Component Value Date   HGBA1C 6.8 09/01/2017   HGBA1C 6.7 05/28/2017   HGBA1C 6.6 02/06/2017  01/25/2016: 7.4% 04/24/2015: HbA1c 7.9% 04/21/2014: HbA1c 6.9%  At last visit, we ruled out type 1 diabetes: Component     Latest Ref Rng & Units 09/01/2017  Hemoglobin A1C      6.8  C-Peptide     0.80 - 3.85 ng/mL 2.87  Islet Cell Ab     Neg:<1:1 Negative  Glutamic Acid Decarb Ab     <5 IU/mL <5  Glucose, Plasma     65 - 99 mg/dL 409 (H)  ZNT8 Antibodies     U/mL <15   She is on: - Metformin 2000 mg with dinner - Glipizide 5 mg 2X a day before meals - Trulicity 1.5 mg weekly - Tresiba 8 units at night - started 3 weeks ago She was on Jardiance 25 mg before b'fast. - started 03/2016 - had 1 yeast inf., but continued to have labial itching >> on a steroid cream + Azo cream 3x a day >> but this did not help so she stopped Jardiance. She tried Trulicity before (2017) and developed nausea and GERD.  No side effects now. Victoza was not covered.  Pt checks her sugars 1 1-2X a day: - am:  123-199, 202 >> 153-208, 223 >> 115-180 - 2h after b'fast: 103-121 >>  97-178, 210, 233 >> 135 - before lunch: 107-118 >> 92-117 >> 104 - 2h after lunch:100-134 >> 125-135 >> 163 - before dinner:  109-122 >> 107-138 >> n/c - 2h after dinner: 127-150 >> 138 >> n/c  - bedtime: 107-109 >> 105 >> n/c - nighttime:  N/c >> 135 >> n/c Lowest sugar was 123 >> 92 >> 104; she has hypoglycemia awareness in the 90s. Highest sugar was 202 >> 233 >> 232 (before [place]).  Glucometer: One Touch Verio  Pt's meals are: - Breakfast: Enterex or  Eggs  + 1/2 Bagel >> smoothie - snack: almonds - Lunch: 2 hard boiled eggs + veggies + fruit cup >> veggies + fruit cups + nuts + yoghurt - Dinner: chicken or pork + veggies or salad  >> chicken + salad or veggies; chinese food - Snacks: 2: cottage cheese, fruit, veggies, nuts >> occas. Almonds, veggies Drinks: 1 diet sodas a day (Dr Reino Kent). No coffee or tea. She has seen nutrition several times in the past.  - No CKD, last BUN/creatinine:  05/15/2017: CMP normal, except glucose 171.  BUN/creatinine 20/1.01, GFR 56. Lab Results  Component Value Date   BUN 19 06/25/2016   BUN 17 04/24/2015   CREATININE 1.03 06/25/2016   CREATININE 0.9 04/24/2015  Not on an ACE inhibitor/ARB. -+ HL; latest Lipids  05/15/2017: Lipids: 189/202/47/102  Lab Results  Component Value Date   CHOL 196 10/23/2015   HDL 147 (A) 10/23/2015   TRIG 145 10/23/2015  On Lipitor 20. - last eye exam was in 2019: No DR  - No numbness and tingling in her feet. On ASA 81.  ROS: Constitutional: + weight gain/no weight loss, no fatigue, no subjective hyperthermia, no subjective hypothermia Eyes: no blurry vision,  no xerophthalmia ENT: no sore throat, no nodules palpated in throat, no dysphagia, no odynophagia, no hoarseness Cardiovascular: no CP/no SOB/no palpitations/no leg swelling Respiratory: + cough/no SOB/no wheezing Gastrointestinal: no N/no V/no D/no C/+ acid reflux Musculoskeletal: no muscle aches/no joint aches Skin: no rashes, no hair loss Neurological: no tremors/no numbness/no tingling/no dizziness  I reviewed pt's medications, allergies, PMH, social hx, family hx, and changes were documented in the history of present illness. Otherwise, unchanged from my initial visit note.  Past Medical History:  Diagnosis Date  . Diabetes mellitus without complication [facility](HCC)    Social History   Social History  . Marital status: Married    Spouse name: N/A  . Number of children: 1   Occupational History  .  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    Social History Main Topics  . Smoking status: Former Smoker    Types: Cigarettes    Quit date: 1997  . Smokeless tobacco: Not on file  . Alcohol use Yes     Comment: 1-2 year  . Drug use: no   Current Outpatient Medications on File Prior to Visit  Medication Sig Dispense Refill  . Continuous Blood Gluc Sensor (FREESTYLE LIBRE 14 DAY SENSOR) MISC 1 each by Does not apply route every 14 (fourteen) days. Change every 2 weeks (Patient not taking: Reported on 09/01/2017) 2 each 11  . Continuous Blood Gluc Sensor (FREESTYLE LIBRE SENSOR SYSTEM) MISC Use 10 day sensor in place of 14 day to check sugars (Patient not taking: Reported on 09/01/2017) 4 each 2  . diphenhydrAMINE (BENADRYL) 25 mg capsule Take 25 mg by mouth 2 (two) times daily.    . Dulaglutide (TRULICITY) 1.5 MG/0.5ML SOPN Inject 1.5 mg under skin weekly 12 pen 3  . glipiZIDE (GLUCOTROL) 5 MG tablet TAKE 1 TABLET BY MOUTH TWICE A DAY BEFORE A MEAL 180 tablet 3  . insulin degludec (TRESIBA FLEXTOUCH) 100 UNIT/ML SOPN FlexTouch Pen Inject 6 units every night 5 pen 2  . Insulin Glargine (LANTUS SOLOSTAR) 100 UNIT/ML Solostar Pen Inject 6 Units into the skin at bedtime. 5 pen 5  . insulin NPH Human (NOVOLIN N) 100 UNIT/ML injection Inject 6 units at bedtime - PENS please! 15 mL 3  . Insulin Pen Needle (CAREFINE PEN NEEDLES) 32G X 4 MM MISC Use once a day 100 each 3  . metFORMIN (GLUCOPHAGE) 1000 MG tablet Take 1 tablet (1,000 mg total) by mouth 2 (two) times daily with a meal. (Patient taking differently: Take 2,000 mg by mouth daily. ) 180 tablet 3  . ONETOUCH DELICA LANCETS 33G MISC Use as directed to test twice daily 200 each 3  . ONETOUCH VERIO test strip Use as directed to test twice daily 200 each 3   No current facility-administered medications on file prior to visit.    Allergies  Allergen Reactions  . Codeine  Nausea And Vomiting  . Morphine And Related Nausea And Vomiting   Family History  Problem Relation Age of Onset  . COPD Mother   . Diverticulitis Father   . Crohn's disease Brother    PGF and F with DM.  PE: BP 120/78   Pulse 85   Ht 5\' 8"  (1.727 m)   Wt 219 lb 9.6 oz (99.6 kg)   SpO2 98%   BMI 33.39 kg/m  Wt Readings from Last 3 Encounters:  12/02/17 219 lb 9.6 oz (99.6 kg)  09/01/17  217 lb 6.4 oz (98.6 kg)  05/28/17 215 lb 3.2 oz (97.6 kg)   Constitutional: overweight, in NAD Eyes: PERRLA, EOMI, no exophthalmos ENT: moist mucous membranes, no thyromegaly, no cervical lymphadenopathy Cardiovascular: RRR, No MRG Respiratory: CTA B Gastrointestinal: abdomen soft, NT, ND, BS+ Musculoskeletal: no deformities, strength intact in all 4 Skin: moist, warm, no rashes Neurological: no tremor with outstretched hands, DTR normal in all 4  ASSESSMENT: 1. DM2, non-insulin-dependent, uncontrolled, without long term complications, but with hyperglycemia  2. Obesity class 1 BMI Classification:  < 18.5 underweight   18.5-24.9 normal weight   25.0-29.9 overweight   30.0-34.9 class I obesity   35.0-39.9 class II obesity   ? 40.0 class III obesity   3. HL  PLAN:  1. Patient with long-standing, uncontrolled, type 2 diabetes, on oral antidiabetic regimen, GLP-1 receptor agonist, and basal insulin suggested at last visit.  I initially suggested NPH, but this was not covered so she is now on Lantus.  She is using a low dose, 6 units per night.  We added long-acting insulin as her sugars were very high in the morning at last visit.  She has been on Jardiance before, but she could not tolerate this due to vaginal itching despite using medicated creams.  She is tolerating Trulicity well, without nausea.  At last visit, we discussed about dawn phenomenon and also about ways to improve her insulin resistance: Exercise, cutting out fatty foods, no eating after 7 PM.  At last visit we also  checked her for type 1 diabetes and the investigation was negative. -She is currently on Tresiba, which was started 3 weeks ago.  She initially started on a very low dose, 4 units daily, which increased every week by 2 units.  She is currently taking 8 units.  However, sugars in the morning, though improved, are still high.  I advised her to increase this to 10 units and then to increase it further by 2 units every 2 to 3 days until the sugars are mostly at target, even with spikes to 140s.  Sugars later in the day are much better, mostly at goal. - I suggested to:  Patient Instructions  Please continue: - Metformin 2000 mg with dinner - Glipizide 5 mg 2X a day before meals - Trulicity 1.5 mg weekly  Please increase: - Tresiba to 10 units at night and increase further by 2 units every 2-3 days until sugars in am <140  Please return in 3-4 months with your sugar log.   - today, HbA1c is 7.4% (higher) - continue checking sugars at different times of the day - check 1x a day, rotating checks - advised for yearly eye exams >> she is UTD - Return to clinic in 3-4 mo with sugar log      2. Obesity class 1 - We unfortunately had to add insulin, which contributes to weight gain-2 pounds since last visit - However, she is also on Trulicity, which should help with this - She is doing a better job with her diet  3. HL - Reviewed latest lipid panel 05/2017: LDL above goal, as are her triglycerides - Continues Lipitor 20 without side effects.  Carlus Pavlov, MD PhD [facility] Endocrinology

## 2017-12-02 NOTE — Addendum Note (Signed)
Addended by: Yolande JollyLAWSON, Reznor Ferrando on: 12/02/2017 02:31 PM   Modules accepted: Orders

## 2017-12-02 NOTE — Patient Instructions (Addendum)
Please continue: - Metformin 2000 mg with dinner - Glipizide 5 mg 2X a day before meals - Trulicity 1.5 mg weekly  Please increase: - Tresiba to 10 units at night and increase further by 2 units every 2-3 days until sugars in am <140  Please return in 3-4 months with your sugar log.

## 2018-01-20 ENCOUNTER — Other Ambulatory Visit: Payer: Self-pay | Admitting: Internal Medicine

## 2018-01-26 ENCOUNTER — Other Ambulatory Visit: Payer: Self-pay | Admitting: Internal Medicine

## 2018-03-19 ENCOUNTER — Ambulatory Visit: Payer: BC Managed Care – PPO | Admitting: Internal Medicine

## 2018-05-19 ENCOUNTER — Other Ambulatory Visit: Payer: Self-pay | Admitting: Family Medicine

## 2018-05-19 DIAGNOSIS — Z1231 Encounter for screening mammogram for malignant neoplasm of breast: Secondary | ICD-10-CM

## 2018-06-17 ENCOUNTER — Ambulatory Visit
Admission: RE | Admit: 2018-06-17 | Discharge: 2018-06-17 | Disposition: A | Discharge status: Home / Self Care | Payer: BC Managed Care – PPO | Source: Ambulatory Visit | Attending: Family Medicine | Admitting: Family Medicine

## 2018-06-17 DIAGNOSIS — Z1231 Encounter for screening mammogram for malignant neoplasm of breast: Secondary | ICD-10-CM

## 2018-06-26 ENCOUNTER — Ambulatory Visit: Payer: BC Managed Care – PPO | Admitting: Internal Medicine

## 2018-08-24 ENCOUNTER — Other Ambulatory Visit: Payer: Self-pay

## 2018-08-25 ENCOUNTER — Other Ambulatory Visit: Payer: Self-pay

## 2018-08-25 ENCOUNTER — Encounter: Payer: Self-pay | Admitting: Internal Medicine

## 2018-08-25 ENCOUNTER — Ambulatory Visit: Payer: BC Managed Care – PPO | Admitting: Internal Medicine

## 2018-08-25 VITALS — BP 120/60 | HR 82 | Temp 98.4°F | Ht 68.0 in | Wt 221.0 lb

## 2018-08-25 DIAGNOSIS — Z6832 Body mass index (BMI) 32.0-32.9, adult: Secondary | ICD-10-CM

## 2018-08-25 DIAGNOSIS — E1165 Type 2 diabetes mellitus with hyperglycemia: Secondary | ICD-10-CM | POA: Diagnosis not present

## 2018-08-25 DIAGNOSIS — E669 Obesity, unspecified: Secondary | ICD-10-CM

## 2018-08-25 DIAGNOSIS — E785 Hyperlipidemia, unspecified: Secondary | ICD-10-CM | POA: Diagnosis not present

## 2018-08-25 LAB — POCT GLYCOSYLATED HEMOGLOBIN (HGB A1C): Hemoglobin A1C: 8.4 % — AB (ref 4.0–5.6)

## 2018-08-25 MED ORDER — INSULIN PEN NEEDLE 32G X 4 MM MISC: 3 refills | Status: DC

## 2018-08-25 MED ORDER — INSULIN DEGLUDEC 100 UNIT/ML ~~LOC~~ SOPN: PEN_INJECTOR | SUBCUTANEOUS | 4 refills | Status: DC

## 2018-08-25 NOTE — Addendum Note (Signed)
Addended by: Darliss Ridgel I on: 08/25/2018 11:42 AM   Modules accepted: Orders

## 2018-08-25 NOTE — Progress Notes (Addendum)
Patient ID: Shelly Morgan, female   DOB: November 08, 1960, 58 y.o.   MRN: [id number]   HPI: Shelly Morgan is a 58 y.o.-year-old female, initially referred by her PCP, Manon Hilding, PA, for management of DM2, dx in 2008, GDM initially in 2002, insulin-dependent, uncontrolled, without long term complications. Last visit 8 mo ago.  She stopped insulin after she ran out following her last visit, as she noticed that she was gaining a lot of weight on it.  Sugar started to worsen afterwards.  Last hemoglobin A1c was: Lab Results  Component Value Date   HGBA1C 7.4 (A) 12/02/2017   HGBA1C 6.8 09/01/2017   HGBA1C 6.7 05/28/2017  01/25/2016: 7.4% 04/24/2015: HbA1c 7.9% 04/21/2014: HbA1c 6.9%  In 2019, we ruled out type 1 diabetes: Component     Latest Ref Rng & Units 09/01/2017  Hemoglobin A1C      6.8  C-Peptide     0.80 - 3.85 ng/mL 2.87  Islet Cell Ab     Neg:<1:1 Negative  Glutamic Acid Decarb Ab     <5 IU/mL <5  Glucose, Plasma     65 - 99 mg/dL 409 (H)  ZNT8 Antibodies     U/mL <15   She is on: - Metformin 2000 mg with dinner - Glipizide 5 mg 2X a day before meals - Trulicity 1.5 mg weekly  >> stopped after last OV She was on Jardiance 25 mg before b'fast. - started 03/2016 - had 1 yeast inf., but continued to have labial itching >> on a steroid cream + Azo cream 3x a day >> but this did not help so she stopped Jardiance. She tried Trulicity before (2017) and developed nausea and GERD.  No side effects now. Victoza was not covered.  Pt checks her sugars 1-2 times a day, per review of her log: - am: 153-208, 223 >> 115-180 >> 137, 178-252 - 2h after b'fast:  97-178, 210, 233 >> 135 >> 201, 214 - before lunch: 107-118 >> 92-117 >> 104 >> 166, 175 - 2h after lunch:100-134 >> 125-135 >> 163 >> 124, 160-216 - before dinner:  109-122 >> 107-138 >> n/c >> 145-187 - 2h after dinner: 127-150 >> 138 >> n/c >> 148-178 - bedtime: 107-109 >> 105 >> n/c >> 168, 169 - nighttime:  N/c >>  135 >> n/c Lowest sugar was 123 >> 92 >> 104 >> 124; she has hypoglycemia awareness in the 90s. Highest sugar was 202 >> 233 >> 232 (before Tresiba) >> 252.  Glucometer: One Touch Verio  Pt's meals are: - Breakfast: Enterex or Eggs  + 1/2 Bagel >> smoothie - snack: almonds - Lunch: 2 hard boiled eggs + veggies + fruit cup >> veggies + fruit cups + nuts + yoghurt - Dinner: chicken or pork + veggies or salad  >> chicken + salad or veggies; chinese food - Snacks: 2: cottage cheese, fruit, veggies, nuts >> occas. Almonds, veggies Drinks: 1 diet sodas a day (Dr Reino Kent).  She has seen nutrition several times in the past.  -No CKD, last BUN/creatinine:  07/27/2018: 18/1.04, GFR 61 05/15/2017: CMP normal, except glucose 171.  BUN/creatinine 20/1.01, GFR 56. Lab Results  Component Value Date   BUN 19 06/25/2016   BUN 17 04/24/2015   CREATININE 1.03 06/25/2016   CREATININE 0.9 04/24/2015  Not on an ACE inhibitor/ARB. -+ HL; latest Lipids  07/27/2018: 215/244/45/121 05/15/2017: 189/202/47/102  Lab Results  Component Value Date   CHOL 196 10/23/2015   HDL 147 (A) 10/23/2015  TRIG 145 10/23/2015  On Crestor 5 -recently changed from Lipitor. - last eye exam was in 2019: No DR -No numbness and tingling in her feet. On ASA 81.  ROS: Constitutional: + weight gain and also weight loss, no fatigue, no subjective hyperthermia, no subjective hypothermia Eyes: no blurry vision, no xerophthalmia ENT: no sore throat, no nodules palpated in neck, no dysphagia, no odynophagia, no hoarseness Cardiovascular: no CP/no SOB/no palpitations/no leg swelling Respiratory: no cough/no SOB/no wheezing Gastrointestinal: no N/no V/no D/no C/no acid reflux Musculoskeletal: no muscle aches/no joint aches Skin: no rashes, no hair loss Neurological: no tremors/no numbness/no tingling/no dizziness  I reviewed pt's medications, allergies, PMH, social hx, family hx, and changes were documented in the history of  present illness. Otherwise, unchanged from my initial visit note.  Past Medical History:  Diagnosis Date  . Diabetes mellitus without complication [facility])    Social History   Social History  . Marital status: Married    Spouse name: N/A  . Number of children: 1   Occupational History  . 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    Social History Main Topics  . Smoking status: Former Smoker    Types: Cigarettes    Quit date: 1997  . Smokeless tobacco: Not on file  . Alcohol use Yes     Comment: 1-2 year  . Drug use: no   Current Outpatient Medications on File Prior to Visit  Medication Sig Dispense Refill  . diphenhydrAMINE (BENADRYL) 25 mg capsule Take 25 mg by mouth 2 (two) times daily.    Marland Kitchen glipiZIDE (GLUCOTROL) 5 MG tablet TAKE 1 TABLET BY MOUTH TWICE A DAY BEFORE A MEAL 180 tablet 3  . insulin degludec (TRESIBA FLEXTOUCH) 100 UNIT/ML SOPN FlexTouch Pen Inject 10 units every night 5 pen 4  . Insulin Pen Needle (CAREFINE PEN NEEDLES) 32G X 4 MM MISC Use once a day 100 each 3  . metFORMIN (GLUCOPHAGE) 1000 MG tablet Take 1 tablet (1,000 mg total) by mouth 2 (two) times daily with a meal. (Patient taking differently: Take 2,000 mg by mouth daily. ) 180 tablet 3  . ONETOUCH DELICA LANCETS 33G MISC Use as directed to test twice daily 200 each 3  . ONETOUCH VERIO test strip Use as directed to test twice daily 200 each 3  . TRULICITY 1.5 MG/0.5ML SOPN INJECT 1 PEN UNDER SKIN WEEKLY 6 pen 3   No current facility-administered medications on file prior to visit.    Allergies  Allergen Reactions  . Codeine Nausea And Vomiting  . Morphine And Related Nausea And Vomiting   Family History  Problem Relation Age of Onset  . COPD Mother   . Diverticulitis Father   . Crohn's disease Brother    PGF and F with DM.  PE: BP 120/60   Pulse 82   Temp 98.4 F (36.9 C)   Ht 5\' 8"  (1.727 m) Comment: measured   Wt 221 lb (100.2 kg)   SpO2 98%   BMI 33.60 kg/m  Wt Readings from Last 3 Encounters:  08/25/18 221 lb (100.2 kg)  12/02/17 219 lb 9.6 oz (99.6 kg)  09/01/17 217 lb 6.4 oz (98.6 kg)   Constitutional: overweight, in NAD Eyes: PERRLA, EOMI, no exophthalmos ENT: moist mucous membranes, no thyromegaly, no cervical lymphadenopathy Cardiovascular: RRR, No MRG Respiratory: CTA B Gastrointestinal: abdomen soft, NT, ND, BS+ Musculoskeletal: no deformities, strength intact in all 4  Skin: moist, warm, no rashes Neurological: no tremor with outstretched hands, DTR normal in all 4  ASSESSMENT: 1. DM2, insulin-dependent, uncontrolled, without long term complications, but with hyperglycemia  2. Obesity class 1 BMI Classification:  < 18.5 underweight   18.5-24.9 normal weight   25.0-29.9 overweight   30.0-34.9 class I obesity   35.0-39.9 class II obesity   ? 40.0 class III obesity   3. HL  PLAN:  1. Patient with longstanding, uncontrolled, type 2 diabetes, on oral antidiabetic regimen weekly GLP-1 receptor agonist, and long-acting insulin added at the beginning of last year.  We increased the dose at last visit and I advised her to continue to titrate it up until sugars in the morning are at goal. However, she stopped insulin since last OV as she was gaining weight.  She started to see her sugars increasing, but unfortunately, she did not restart insulin or change her diet afterwards.  At today's visit, sugars are mostly high in the morning, but also later in the day. -Of note, she has been on an SGLT2 inhibitor in the past (Jardiance), but she could not tolerate this due to vaginal itching despite using medicated creams.  She is tolerating Trulicity well, without nausea.  - We discussed again how to improve her insulin resistance and implicitly improve her diabetes.  I gave her specific suggestions: Advised her to not snack between meals, only drink water, and also to consider  intermittent fasting.  Given reference. -At this point, will restart insulin, but we discussed that if her insulin resistance decreases and her sugars improve, she may be able to come off the insulin in the future. - I suggested to:  Patient Instructions  Please continue: - Metformin 2000 mg with dinner - Glipizide 5 mg 2X a day before meals - Trulicity 1.5 mg weekly  Please restart: - Tresiba 10 units at bedtime. Increase by 2 units every 2 days until sugars in am <140or you reach 30 units  Read the following books: Dr. Wylene Simmer - The obesity code   Please return in 3 months with your sugar log.   - today, HbA1c is 8.4% (higher) - continue checking sugars at different times of the day - check 1-2x a day, rotating checks - advised for yearly eye exams >> she is UTD - Return to clinic in 3 mo with sugar log      2. Obesity class 1 -Gained a net amount of 3 pounds since last visit, however, she tells me that her weight increased from 219 to to 35 pounds with titration of the insulin and she then started to lose weight after she stopped insulin. -Discussed about dietary changes (see above) -Continue Trulicity which should also help with weight loss  3. HL -Reviewed latest lipid panel from 05/2017: LDL above goal, as are her triglycerides -Continues Crestor 5 without side effects  Carlus Pavlov, MD PhD [facility] Endocrinology

## 2018-08-25 NOTE — Patient Instructions (Addendum)
Please continue: - Metformin 2000 mg with dinner - Glipizide 5 mg 2X a day before meals - Trulicity 1.5 mg weekly  Please restart: - Tresiba 10 units at bedtime. Increase by 2 units every 2 days until sugars in am <140or you reach 30 units  Read the following books: Dr. Wylene Simmer - The obesity code   Please return in 3 months with your sugar log.

## 2018-09-02 ENCOUNTER — Other Ambulatory Visit: Payer: Self-pay | Admitting: Internal Medicine

## 2018-12-11 ENCOUNTER — Other Ambulatory Visit: Payer: Self-pay

## 2018-12-15 ENCOUNTER — Encounter: Payer: Self-pay | Admitting: Internal Medicine

## 2018-12-15 ENCOUNTER — Other Ambulatory Visit: Payer: Self-pay

## 2018-12-15 ENCOUNTER — Ambulatory Visit: Payer: BC Managed Care – PPO | Admitting: Internal Medicine

## 2018-12-15 VITALS — BP 118/80 | HR 82 | Ht 68.0 in | Wt 221.0 lb

## 2018-12-15 DIAGNOSIS — E785 Hyperlipidemia, unspecified: Secondary | ICD-10-CM | POA: Diagnosis not present

## 2018-12-15 DIAGNOSIS — E1165 Type 2 diabetes mellitus with hyperglycemia: Secondary | ICD-10-CM | POA: Diagnosis not present

## 2018-12-15 LAB — LIPID PANEL
Cholesterol: 206 mg/dL — ABNORMAL HIGH (ref 0–200)
HDL: 46.6 mg/dL (ref >39.00)
LDL Cholesterol: 120 mg/dL — ABNORMAL HIGH (ref 0–99)
NonHDL: 159.72
Total CHOL/HDL Ratio: 4
Triglycerides: 200 mg/dL — ABNORMAL HIGH (ref 0.0–149.0)
VLDL: 40 mg/dL (ref 0.0–40.0)

## 2018-12-15 LAB — POCT GLYCOSYLATED HEMOGLOBIN (HGB A1C): Hemoglobin A1C: 7 % — AB (ref 4.0–5.6)

## 2018-12-15 NOTE — Progress Notes (Signed)
Patient ID: Shelly Morgan, female   DOB: 1960-10-24, 58 y.o.   MRN: [id number]   HPI: Shelly LeaderChristine Schar is a 58 y.o.-year-old female, initially referred by her PCP, Manon HildingJessica Mauney, PA, for management of DM2, dx in 2008, GDM initially in 2002, insulin-dependent, uncontrolled, without long term complications. Last visit 4 months ago.  At last visit, HbA1c was higher after she stopped insulin due to weight gain.  We restarted a low dose insulin at that time.   She started to improve her diet since last visit towards a more plant-based one and also doing time restricted feeding -eats breakfast and lunch and skips dinner.  She is feeling better and her sugars have improved.  She did not gain weight since last visit.  Last hemoglobin A1c was: Lab Results  Component Value Date   HGBA1C 8.4 (A) 08/25/2018   HGBA1C 7.4 (A) 12/02/2017   HGBA1C 6.8 09/01/2017  01/25/2016: 7.4% 04/24/2015: HbA1c 7.9% 04/21/2014: HbA1c 6.9%  In 2019, we ruled out type 1 diabetes: Component     Latest Ref Rng & Units 09/01/2017  Hemoglobin A1C      6.8  C-Peptide     0.80 - 3.85 ng/mL 2.87  Islet Cell Ab     Neg:<1:1 Negative  Glutamic Acid Decarb Ab     <5 IU/mL <5  Glucose, Plasma     65 - 99 mg/dL 409172 (H)  ZNT8 Antibodies     U/mL <15   She is on: - Metformin 2000 mg with dinner - Glipizide 5 mg 2X a day before meals - Trulicity 1.5 mg weekly - Tresiba 10 >> 14 units daily - restarted 08/2018 She was on Jardiance 25 mg before b'fast. - started 03/2016 - had 1 yeast inf., but continued to have labial itching >> on a steroid cream + Azo cream 3x a day >> but this did not help so she stopped Jardiance. She tried Trulicity before (2017) and developed nausea and GERD.  No side effects now. Victoza was not covered.  Pt checks her sugars 1-2 times a day per review of her log: - am: 115-180 >> 137, 178-252 >> 92-155, 174, 184 - 2h after b'fast:  97-178, 210, 233 >> 135 >> 201, 214 >> n/c - before  lunch:92-117 >> 104 >> 166, 175 >> n/c - 2h after lunch:125-135 >> 163 >> 124, 160-216 >> n/c  - before dinner: 107-138 >> n/c >> 145-187 >> n/c - 2h after dinner: 127-150 >> 138 >> n/c >> 148-178 >> n/c - bedtime: 107-109 >> 105 >> n/c >> 168, 169 >> n/c - nighttime:  N/c >> 135 >> n/c Lowest sugar was 124 >> 92; she has hypoglycemia awareness in the 90s. Highest sugar was 25 >> 287 (March).  Glucometer: One Touch Verio  Pt's meals are: - Breakfast: Enterex or Eggs  + 1/2 Bagel >> smoothie - snack: almonds - Lunch: 2 hard boiled eggs + veggies + fruit cup >> veggies + fruit cups + nuts + yoghurt - Dinner: chicken or pork + veggies or salad  >> chicken + salad or veggies; chinese food - Snacks: 2: cottage cheese, fruit, veggies, nuts >> occas. Almonds, veggies Drinks: 1 diet sodas a day (Dr Reino KentPepper).  She has seen nutrition several times in the past.  -No CKD, last BUN/creatinine:  07/27/2018: 18/1.04, GFR 61 05/15/2017: CMP normal, except glucose 171.  BUN/creatinine 20/1.01, GFR 56. Lab Results  Component Value Date   BUN 19 06/25/2016   BUN 17 04/24/2015  CREATININE 1.03 06/25/2016   CREATININE 0.9 04/24/2015  Not on an ACE inhibitor/ARB. -+ HL; latest Lipids  07/27/2018: 215/244/45/121 05/15/2017: 189/202/47/102  Lab Results  Component Value Date   CHOL 196 10/23/2015   HDL 147 (A) 10/23/2015   TRIG 145 10/23/2015  Off Crestor 5 (but stopped when ran out...), previously on Lipitor - last eye exam was in 2019: No DR - no numbness and tingling in her feet. On ASA 81.  [place] Fall of 2020.  ROS: Constitutional: no weight gain/no weight loss, no fatigue, no subjective hyperthermia, no subjective hypothermia Eyes: no blurry vision, no xerophthalmia ENT: no sore throat, no nodules palpated in neck, no dysphagia, no odynophagia, no hoarseness Cardiovascular: no CP/no SOB/no palpitations/no leg swelling Respiratory: no cough/no  SOB/no wheezing Gastrointestinal: no N/no V/no D/no C/no acid reflux Musculoskeletal: no muscle aches/no joint aches Skin: no rashes, no hair loss Neurological: no tremors/no numbness/no tingling/no dizziness  I reviewed pt's medications, allergies, PMH, social hx, family hx, and changes were documented in the history of present illness. Otherwise, unchanged from my initial visit note.  Past Medical History:  Diagnosis Date  . Diabetes mellitus without complication [facility](HCC)    Social History   Social History  . Marital status: Married    Spouse name: N/A  . Number of children: 1   Occupational History  . 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    Social History Main Topics  . Smoking status: Former Smoker    Types: Cigarettes    Quit date: 1997  . Smokeless tobacco: Not on file  . Alcohol use Yes     Comment: 1-2 year  . Drug use: no   Current Outpatient Medications on File Prior to Visit  Medication Sig Dispense Refill  . BD PEN NEEDLE NANO 2ND GEN 32G X 4 MM MISC Please specify directions, refills and quantity 200 each 2  . diphenhydrAMINE (BENADRYL) 25 mg capsule Take 25 mg by mouth 2 (two) times daily.    Marland Kitchen. glipiZIDE (GLUCOTROL) 5 MG tablet TAKE 1 TABLET BY MOUTH TWICE A DAY BEFORE A MEAL 180 tablet 3  . insulin degludec (TRESIBA FLEXTOUCH) 100 UNIT/ML SOPN FlexTouch Pen Inject 10 units every night 5 pen 4  . metFORMIN (GLUCOPHAGE) 1000 MG tablet Take 1 tablet (1,000 mg total) by mouth 2 (two) times daily with a meal. (Patient taking differently: Take 2,000 mg by mouth daily. ) 180 tablet 3  . ONETOUCH DELICA LANCETS 33G MISC Use as directed to test twice daily 200 each 3  . ONETOUCH VERIO test strip Use as directed to test twice daily 200 each 3  . rosuvastatin (CRESTOR) 5 MG tablet Take 5 mg by mouth daily.    . TRULICITY 1.5 MG/0.5ML SOPN INJECT 1 PEN UNDER SKIN WEEKLY 6 pen 3   No current  facility-administered medications on file prior to visit.    Allergies  Allergen Reactions  . Codeine Nausea And Vomiting  . Morphine And Related Nausea And Vomiting   Family History  Problem Relation Age of Onset  . COPD Mother   . Diverticulitis Father   . Crohn's disease Brother    PGF and F with DM.  PE: BP 118/80   Pulse 82   Ht 5\' 8"  (1.727 m)   Wt 221 lb (100.2 kg)   SpO2 98%   BMI 33.60 kg/m  Wt Readings  from Last 3 Encounters:  12/15/18 221 lb (100.2 kg)  08/25/18 221 lb (100.2 kg)  12/02/17 219 lb 9.6 oz (99.6 kg)   Constitutional: overweight, in NAD Eyes: PERRLA, EOMI, no exophthalmos ENT: moist mucous membranes, no thyromegaly, no cervical lymphadenopathy Cardiovascular: RRR, No MRG Respiratory: CTA B Gastrointestinal: abdomen soft, NT, ND, BS+ Musculoskeletal: no deformities, strength intact in all 4 Skin: moist, warm, no rashes Neurological: no tremor with outstretched hands, DTR normal in all 4  ASSESSMENT: 1. DM2, insulin-dependent, uncontrolled, without long term complications, but with hyperglycemia  2. Obesity class 1 BMI Classification:  < 18.5 underweight   18.5-24.9 normal weight   25.0-29.9 overweight   30.0-34.9 class I obesity   35.0-39.9 class II obesity   ? 40.0 class III obesity   3. HL  PLAN:  1. Patient with Longstanding, uncontrolled, type 2 diabetes, on oral antidiabetic regimen, weekly GLP-1 receptor agonist, and now long-acting insulin added back at last visit.  At that time her sugars were higher as she stopped insulin due to weight gain. -Of note, she has been on an SGLT2 inhibitor in the past (Jardiance) but she could not tolerate this due to vaginal itching despite using medicated creams.  She is tolerating Trulicity well, without nausea or constipation. -At last visit we discussed about improving her diet and I suggested a low fat diet and also to look into intermittent fasting.  Gave her references. -At this visit,  sugars are much better but she only checks in the morning and I advised her to rotate the checks to also include sugars later in the day.  She is changing the dose of Tresiba between 12 and 14 units depending on her sugars.  For now, we will continue the current regimen but if she continues with the dietary changes that she already stated and she starts losing weight, she may be able to come off [place] and glipizide. - I suggested to:  Patient Instructions  Please continue: - Metformin 2000 mg with dinner - Glipizide 5 mg 2X a day before meals - Trulicity 1.5 mg weekly - Tresiba 14 units daily  Please return in 4 months with your sugar log.   - we checked her HbA1c: 7.0% (improved) - advised to check sugars at different times of the day - 1-2x a day, rotating check times - advised for yearly eye exams >> she is due - return to clinic in 4 months      2. Obesity class 1 -She gained a significant amount of weight in the past on insulin so she had stopped the insulin before last visit.  Unfortunately, sugars are higher so we had to restart her on insulin, low dose.  We also discussed at that time about dietary changes and I suggested intermittent fasting. -We will continue Trulicity which should also help with weight loss - weight stable at this visit.  3. HL - Reviewed latest lipid panel from earlier this year: LDL above goal, as are her triglycerides -She was on Crestor 5 without side effects, but stopped after she improved her diet. -She is due for another lipid panel >> we will check today since she is fasting.  If LDL not lower than 100, we may need to restart Crestor.  Component     Latest Ref Rng & Units 12/15/2018  Cholesterol     0 - 200 mg/dL 206 (H)  Triglycerides     0.0 - 149.0 mg/dL 200.0 (H)  HDL Cholesterol     >  39.00 mg/dL [id number]  VLDL     0.0 - 09.840.0 mg/dL 11.940.0  LDL (calc)     0 - 99 mg/dL 147120 (H)  Total CHOL/HDL Ratio      4  NonHDL      159.72  LDL is above  target, as are her triglycerides.  I would suggest to restart Crestor 5.  Carlus Pavlovristina Dvon Jiles, MD PhD [facility]

## 2018-12-15 NOTE — Patient Instructions (Addendum)
Please continue: - Metformin 2000 mg with dinner - Glipizide 5 mg 2X a day before meals - Trulicity 1.5 mg weekly - Tresiba 14 units daily   Please return in 4  months with your sugar log.

## 2018-12-18 ENCOUNTER — Telehealth: Payer: Self-pay

## 2018-12-18 NOTE — Telephone Encounter (Signed)
-----   Message from Philemon Kingdom, MD sent at 12/16/2018 11:37 AM EDT ----- Lenna Sciara, can you please call pt: LDL is above target, as are her triglycerides.  I would suggest to restart Crestor 5.

## 2018-12-21 ENCOUNTER — Other Ambulatory Visit: Payer: Self-pay

## 2018-12-21 MED ORDER — ROSUVASTATIN CALCIUM 5 MG PO TABS: 5.0000 mg | ORAL_TABLET | Freq: Every day | ORAL | 1 refills | Status: DC

## 2018-12-22 NOTE — Telephone Encounter (Signed)
Notified patient of message from Dr. Gherghe, patient expressed understanding and agreement. No further questions.  

## 2018-12-31 ENCOUNTER — Other Ambulatory Visit: Payer: Self-pay | Admitting: Internal Medicine

## 2019-01-24 ENCOUNTER — Other Ambulatory Visit: Payer: Self-pay | Admitting: Internal Medicine

## 2019-02-08 ENCOUNTER — Other Ambulatory Visit: Payer: Self-pay | Admitting: Internal Medicine

## 2019-04-19 ENCOUNTER — Other Ambulatory Visit: Payer: Self-pay

## 2019-04-21 ENCOUNTER — Other Ambulatory Visit: Payer: Self-pay

## 2019-04-21 ENCOUNTER — Ambulatory Visit (INDEPENDENT_AMBULATORY_CARE_PROVIDER_SITE_OTHER): Payer: BC Managed Care – PPO | Admitting: Internal Medicine

## 2019-04-21 ENCOUNTER — Encounter: Payer: Self-pay | Admitting: Internal Medicine

## 2019-04-21 VITALS — BP 110/60 | HR 72 | Ht 68.0 in | Wt 216.0 lb

## 2019-04-21 DIAGNOSIS — E785 Hyperlipidemia, unspecified: Secondary | ICD-10-CM | POA: Diagnosis not present

## 2019-04-21 DIAGNOSIS — E669 Obesity, unspecified: Secondary | ICD-10-CM | POA: Diagnosis not present

## 2019-04-21 DIAGNOSIS — E1165 Type 2 diabetes mellitus with hyperglycemia: Secondary | ICD-10-CM

## 2019-04-21 DIAGNOSIS — Z6832 Body mass index (BMI) 32.0-32.9, adult: Secondary | ICD-10-CM

## 2019-04-21 LAB — LIPID PANEL
Cholesterol: 144 mg/dL (ref 0–200)
HDL: 45.5 mg/dL (ref >39.00)
LDL Cholesterol: 72 mg/dL (ref 0–99)
NonHDL: 98.28
Total CHOL/HDL Ratio: 3
Triglycerides: 130 mg/dL (ref 0.0–149.0)
VLDL: 26 mg/dL (ref 0.0–40.0)

## 2019-04-21 LAB — POCT GLYCOSYLATED HEMOGLOBIN (HGB A1C): Hemoglobin A1C: 6.5 % — AB (ref 4.0–5.6)

## 2019-04-21 LAB — MICROALBUMIN / CREATININE URINE RATIO
Creatinine,U: 146.2 mg/dL
Microalb Creat Ratio: 0.5 mg/g (ref 0.0–30.0)
Microalb, Ur: <0.7 mg/dL (ref 0.0–1.9)

## 2019-04-21 NOTE — Progress Notes (Signed)
Patient ID: Shelly Morgan, female   DOB: 1960-07-28, 58 y.o.   MRN: [id number]   HPI: Shelly Morgan is a 58 y.o.-year-old female, initially referred by her PCP, Manon Hilding, PA, for management of DM2, dx in 2008, GDM initially in 2002, insulin-dependent, uncontrolled, without long term complications. Last visit 4 months ago.  Before last visit, she started improve her diet towards a more plant-based diet and also doing time restricted feeding -eats breakfast and lunch and skips dinner.  She was feeling better and sugars improved.  She continues on this diet when sugars improved further.  She also lost 5 pounds since last visit.   Reviewed HbA1c levels: Lab Results  Component Value Date   HGBA1C 7.0 (A) 12/15/2018   HGBA1C 8.4 (A) 08/25/2018   HGBA1C 7.4 (A) 12/02/2017   HGBA1C 6.8 09/01/2017   HGBA1C 6.7 05/28/2017   HGBA1C 6.6 02/06/2017   HGBA1C 6.6 11/01/2016   HGBA1C 6.4 06/25/2016   HGBA1C 7.2 02/02/2016   HGBA1C 8.3 10/23/2015  01/25/2016: 7.4% 04/24/2015: HbA1c 7.9% 04/21/2014: HbA1c 6.9%  She is on: - Metformin 2000 mg with dinner - Glipizide 5 mg 2X a day before meals - Trulicity 1.5 mg weekly - Tresiba 14 units daily  - restarted 08/2018 (she was off insulin due to weight gain in the past) She was on Jardiance 25 mg before b'fast. - started 03/2016 - had 1 yeast inf., but continued to have labial itching >> on a steroid cream + Azo cream 3x a day >> but this did not help so she stopped Jardiance. She tried Trulicity before (2017) and developed nausea and GERD.  No side effects now. Victoza was not covered.  Pt checks her sugars 1-2 times a day per review of her carefully kept log: - am: 115-180 >> 137, 178-252 >> 92-155, 174, 184 >> 103-140, 176 (forgot insulin), 182 - 2h after b'fast:  97-178, 210, 233 >> 135 >> 201, 214 >> n/c > 89-168 - before lunch:92-117 >> 104 >> 166, 175 >> n/c >> 107-130 - 2h after lunch:125-135 >> 163 >> 124, 160-216 >> n/c >> 114-121 -  before dinner: 107-138 >> n/c >> 145-187 >> n/c >> 90-127 - 2h after dinner: 127-150 >> 138 >> n/c >> 148-178 >> n/c >> 82-137 - bedtime: 107-109 >> 105 >> n/c >> 168, 169 >> n/c >> 95-139, 181 - nighttime:  N/c >> 135 >> n/c Lowest sugar was 124 >> 92 >> 87; she has hypoglycemia awareness in the 90s. Highest sugar was 25 >> 287 >> 182.  Glucometer: One Touch Verio  Pt's meals are: - Breakfast: Enterex or Eggs  + 1/2 Bagel >> smoothie - snack: almonds - Lunch: 2 hard boiled eggs + veggies + fruit cup >> veggies + fruit cups + nuts + yoghurt - Dinner: chicken or pork + veggies or salad  >> chicken + salad or veggies; chinese food - Snacks: 2: cottage cheese, fruit, veggies, nuts >> occas. Almonds, veggies Drinks: 1 diet sodas a day (Dr Reino Kent).  She has seen nutrition several time in the past.  No CKD, last BUN/creatinine:  07/27/2018: 18/1.04, GFR 61 05/15/2017: CMP normal, except glucose 171.  BUN/creatinine 20/1.01, GFR 56. Lab Results  Component Value Date   BUN 19 06/25/2016   BUN 17 04/24/2015   CREATININE 1.03 06/25/2016   CREATININE 0.9 04/24/2015  Not on ACE inhibitor/ARB. + HL; latest Lipids  Lab Results  Component Value Date   CHOL 206 (H) 12/15/2018   HDL 46.60  12/15/2018   LDLCALC 120 (H) 12/15/2018   TRIG 200.0 (H) 12/15/2018   CHOLHDL 4 12/15/2018  07/27/2018: 215/244/45/121 05/15/2017: 189/202/47/102  Previously on Lipitor, and then off.  We started Crestor 5 at last visit. - last eye exam was 03/2019: No DR - no numbness and tingling in her feet. On ASA 81.  Daughter studies dancing in [place] - starting the Fall of 2020.  ROS: Constitutional: no weight gain/+ weight loss, no fatigue, no subjective hyperthermia, no subjective hypothermia Eyes: no blurry vision, no xerophthalmia ENT: no sore throat, no nodules palpated in neck, no dysphagia, no odynophagia, no hoarseness Cardiovascular: no CP/no SOB/no palpitations/no leg swelling Respiratory: no cough/no  SOB/no wheezing Gastrointestinal: no N/no V/no D/no C/no acid reflux Musculoskeletal: no muscle aches/no joint aches Skin: no rashes, no hair loss Neurological: no tremors/no numbness/no tingling/no dizziness  I reviewed pt's medications, allergies, PMH, social hx, family hx, and changes were documented in the history of present illness. Otherwise, unchanged from my initial visit note.  Past Medical History:  Diagnosis Date  . Diabetes mellitus without complication [facility])    Social History   Social History  . Marital status: Married    Spouse name: N/A  . Number of children: 1   Occupational History  . 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    Social History Main Topics  . Smoking status: Former Smoker    Types: Cigarettes    Quit date: 1997  . Smokeless tobacco: Not on file  . Alcohol use Yes     Comment: 1-2 year  . Drug use: no   Current Outpatient Medications on File Prior to Visit  Medication Sig Dispense Refill  . BD PEN NEEDLE NANO 2ND GEN 32G X 4 MM MISC Please specify directions, refills and quantity 200 each 2  . diphenhydrAMINE (BENADRYL) 25 mg capsule Take 25 mg by mouth 2 (two) times daily.    Marland Kitchen glipiZIDE (GLUCOTROL) 5 MG tablet TAKE 1 TABLET BY MOUTH TWICE A DAY BEFORE A MEAL 180 tablet 3  . insulin degludec (TRESIBA FLEXTOUCH) 100 UNIT/ML SOPN FlexTouch Pen Inject 10 units every night 5 pen 4  . metFORMIN (GLUCOPHAGE) 1000 MG tablet Take 1 tablet (1,000 mg total) by mouth 2 (two) times daily with a meal. (Patient taking differently: Take 2,000 mg by mouth daily. ) 180 tablet 3  . ONETOUCH DELICA LANCETS 33G MISC Use as directed to test twice daily 200 each 3  . ONETOUCH VERIO test strip USE AS DIRECTED TO TEST TWICE DAILY 200 strip 3  . rosuvastatin (CRESTOR) 5 MG tablet Take 1 tablet (5 mg total) by mouth daily. 90 tablet 1  . TRULICITY 1.5 MG/0.5ML SOPN INJECT 1 PEN UNDER SKIN  WEEKLY 6 pen 3   No current facility-administered medications on file prior to visit.    Allergies  Allergen Reactions  . Codeine Nausea And Vomiting  . Morphine And Related Nausea And Vomiting   Family History  Problem Relation Age of Onset  . COPD Mother   . Diverticulitis Father   . Crohn's disease Brother    PGF and F with DM.  PE: BP 110/60   Pulse 72   Ht 5\' 8"  (1.727 m)   Wt 216 lb (98 kg)   SpO2 97%   BMI 32.84 kg/m  Wt Readings from Last 3 Encounters:  04/21/19 216 lb (98 kg)  12/15/18 221 lb (100.2 kg)  08/25/18 221 lb (100.2 kg)   Constitutional: overweight, in NAD Eyes: PERRLA, EOMI, no exophthalmos ENT: moist mucous membranes, no thyromegaly, no cervical lymphadenopathy Cardiovascular: RRR, No MRG Respiratory: CTA B Gastrointestinal: abdomen soft, NT, ND, BS+ Musculoskeletal: no deformities, strength intact in all 4 Skin: moist, warm, no rashes Neurological: no tremor with outstretched hands, DTR normal in all 4  ASSESSMENT: 1. DM2, insulin-dependent, uncontrolled, without long term complications, but with hyperglycemia  In 2019, we ruled out type 1 diabetes: Component     Latest Ref Rng & Units 09/01/2017  Hemoglobin A1C      6.8  C-Peptide     0.80 - 3.85 ng/mL 2.87  Islet Cell Ab     Neg:<1:1 Negative  Glutamic Acid Decarb Ab     <5 IU/mL <5  Glucose, Plasma     65 - 99 mg/dL 308172 (H)  ZNT8 Antibodies     U/mL <15   2. Obesity class 1 BMI Classification:  < 18.5 underweight   18.5-24.9 normal weight   25.0-29.9 overweight   30.0-34.9 class I obesity   35.0-39.9 class II obesity   ? 40.0 class III obesity   3. HL  PLAN:  1. Patient with longstanding, uncontrolled, type 2 diabetes, on oral antidiabetic regimen, weekly GLP-1 receptor agonist, and now back on long-acting insulin added earlier in the year.  Of note, she has been on an SGLT2 inhibitor in the past but could not tolerate it due to vaginal itching despite using  medicated creams.  She is tolerating the GLP-1 receptor agonist well, without nausea or constipation -At last visit, sugars are much better after changing her diet towards a more plant-based one and also doing intermittent fasting her HbA1c was 7.0% and she was feeling much better.  We did not change the regimen then we discussed about decreasing and even coming off insulin if she continues with a positive diet changes and sugars stay control. -At this visit, sugars have improved further as she continues on her intermittent fast diet.  She has only occasional mild hyperglycemic spikes, sometimes after dietary indiscretions but sometimes without a clear reason.  Occasionally, she forgets her Tresiba dose at night and we discussed that she can take this at anytime of the day.  I do not feel that we need to change her regimen at this visit, but after the holidays, I am hoping we can reduce the [place] and possibly glipizide. - I suggested to:  Patient Instructions  Please continue: - Metformin 2000 mg with dinner - Glipizide 5 mg 2X a day before meals - Trulicity 1.5 mg weekly - Tresiba 14 units daily  Continue: - Rosuvastatin 5 mg daily  Please stop at the lab.  Please return in 4 months with your sugar log.   - we checked her HbA1c: 6.5% (lower) - advised to check sugars at different times of the day - 1-2x a day, rotating check times - advised for yearly eye exams >> she is UTD - UTD with flu shot - return to clinic in 4 months     2. Obesity class 1 -She gained a significant amount of weight after starting insulin in the past and was even off insulin for this reason.  Unfortunately, sugars worsened so we had to restart insulin. -she continues intermittent fasting -+ 5 lbs weight loss since last visit -We will continue the GLP-1 receptor agonist, which should also help with weight loss  3. HL - Reviewed latest lipid panel from last  visit LDL was higher, triglycerides were also high,  while HDL was at goal: Lab Results  Component Value Date   CHOL 206 (H) 12/15/2018   HDL 46.60 12/15/2018   LDLCALC 120 (H) 12/15/2018   TRIG 200.0 (H) 12/15/2018   CHOLHDL 4 12/15/2018  - At last visit, I suggested to restart Crestor 5 mg daily.  She was previously on this but stopped after she improved her diet. - We will recheck her lipid panel at this visit.  She is fasting.  Component     Latest Ref Rng & Units 04/21/2019  Glucose     65 - 99 mg/dL 144 (H)  BUN     7 - 25 mg/dL 12  Creatinine     0.50 - 1.05 mg/dL 1.02  GFR, Est Non African American     > OR = 60 mL/min/1.53m2 60  GFR, Est African American     > OR = 60 mL/min/1.59m2 70  BUN/Creatinine Ratio     6 - 22 (calc) NOT APPLICABLE  Sodium     342 - 146 mmol/L 140  Potassium     3.5 - 5.3 mmol/L 4.2  Chloride     98 - 110 mmol/L 105  CO2     20 - 32 mmol/L 23  Calcium     8.6 - 10.4 mg/dL 9.9  Total Protein     6.1 - 8.1 g/dL 6.5  Albumin MSPROF     3.6 - 5.1 g/dL 4.2  Globulin     1.9 - 3.7 g/dL (calc) 2.3  AG Ratio     1.0 - 2.5 (calc) 1.8  Total Bilirubin     0.2 - 1.2 mg/dL 0.6  Alkaline phosphatase (APISO)     37 - 153 U/L 62  AST     10 - 35 U/L 17  ALT     6 - 29 U/L 21  Cholesterol     0 - 200 mg/dL 144  Triglycerides     0.0 - 149.0 mg/dL 130.0  HDL Cholesterol     >39.00 mg/dL 45.50  VLDL     0.0 - 40.0 mg/dL 26.0  LDL (calc)     0 - 99 mg/dL 72  Total CHOL/HDL Ratio      3  NonHDL      98.28  Microalb, Ur     0.0 - 1.9 mg/dL <0.7  Creatinine,U     mg/dL 146.2  MICROALB/CREAT RATIO     0.0 - 30.0 mg/g 0.5  Kidney function stable.  ACR normal.  Lipid levels much improved.  Philemon Kingdom, MD PhD [facility]

## 2019-04-21 NOTE — Patient Instructions (Addendum)
Please continue: - Metformin 2000 mg with dinner - Glipizide 5 mg 2X a day before meals - Trulicity 1.5 mg weekly - Tresiba 14 units daily  Continue: - Rosuvastatin 5 mg daily  Please stop at the lab.  Please return in 4 months with your sugar log.

## 2019-04-22 ENCOUNTER — Telehealth: Payer: Self-pay

## 2019-04-22 LAB — COMPLETE METABOLIC PANEL WITH GFR
AG Ratio: 1.8 (calc) (ref 1.0–2.5)
ALT: 21 U/L (ref 6–29)
AST: 17 U/L (ref 10–35)
Albumin: 4.2 g/dL (ref 3.6–5.1)
Alkaline phosphatase (APISO): 62 U/L (ref 37–153)
BUN: 12 mg/dL (ref 7–25)
CO2: 23 mmol/L (ref 20–32)
Calcium: 9.9 mg/dL (ref 8.6–10.4)
Chloride: 105 mmol/L (ref 98–110)
Creat: 1.02 mg/dL (ref 0.50–1.05)
GFR, Est African American: 70 mL/min/{1.73_m2} (ref >=60)
GFR, Est Non African American: 60 mL/min/{1.73_m2} (ref >=60)
Globulin: 2.3 g/dL (calc) (ref 1.9–3.7)
Glucose, Bld: 144 mg/dL — ABNORMAL HIGH (ref 65–99)
Potassium: 4.2 mmol/L (ref 3.5–5.3)
Sodium: 140 mmol/L (ref 135–146)
Total Bilirubin: 0.6 mg/dL (ref 0.2–1.2)
Total Protein: 6.5 g/dL (ref 6.1–8.1)

## 2019-04-22 NOTE — Telephone Encounter (Signed)
-----   Message from Philemon Kingdom, MD sent at 04/22/2019  2:01 PM EST ----- Lenna Sciara, can you please call pt: Kidney function is stable.  Urine protein are not elevated.  Lipid levels much improved!  Continue Crestor.

## 2019-04-22 NOTE — Telephone Encounter (Signed)
Notified patient of message from Dr. Gherghe, patient expressed understanding and agreement. No further questions.  

## 2019-06-16 ENCOUNTER — Other Ambulatory Visit: Payer: Self-pay | Admitting: Internal Medicine

## 2019-07-07 ENCOUNTER — Other Ambulatory Visit: Payer: Self-pay | Admitting: Internal Medicine

## 2019-07-09 ENCOUNTER — Telehealth: Payer: Self-pay | Admitting: Internal Medicine

## 2019-07-09 MED ORDER — METFORMIN HCL 1000 MG PO TABS: ORAL_TABLET | ORAL | 2 refills | Status: DC

## 2019-07-09 NOTE — Telephone Encounter (Signed)
MEDICATION: metFORMIN (GLUCOPHAGE) 1000 MG tablet  PHARMACY:   [id number] [place], [place] - [address] Phone:  [phone]  Fax:  [phone]      IS THIS A 90 DAY SUPPLY : Yes  IS PATIENT OUT OF MEDICATION: No  IF NOT; HOW MUCH IS LEFT: 20 tablets  LAST APPOINTMENT DATE: @2 /08/2019  NEXT APPOINTMENT DATE:@3 /19/2021  DO WE HAVE YOUR PERMISSION TO LEAVE A DETAILED MESSAGE: Yes  OTHER COMMENTS:  Patient requests new RX with refills-Patient's PA no longer filling the above RX  **Let patient know to contact pharmacy at the end of the day to make sure medication is ready. **  ** Please notify patient to allow 48-72 hours to process**  **Encourage patient to contact the pharmacy for refills or they can request refills through [facility]**

## 2019-07-09 NOTE — Telephone Encounter (Signed)
RX sent

## 2019-08-18 ENCOUNTER — Other Ambulatory Visit: Payer: Self-pay

## 2019-08-20 ENCOUNTER — Encounter: Payer: Self-pay | Admitting: Internal Medicine

## 2019-08-20 ENCOUNTER — Ambulatory Visit: Payer: BC Managed Care – PPO | Admitting: Internal Medicine

## 2019-08-20 ENCOUNTER — Other Ambulatory Visit: Payer: Self-pay

## 2019-08-20 VITALS — BP 120/60 | HR 78 | Ht 68.0 in | Wt 218.0 lb

## 2019-08-20 DIAGNOSIS — E669 Obesity, unspecified: Secondary | ICD-10-CM

## 2019-08-20 DIAGNOSIS — E785 Hyperlipidemia, unspecified: Secondary | ICD-10-CM

## 2019-08-20 DIAGNOSIS — Z6832 Body mass index (BMI) 32.0-32.9, adult: Secondary | ICD-10-CM

## 2019-08-20 DIAGNOSIS — E1165 Type 2 diabetes mellitus with hyperglycemia: Secondary | ICD-10-CM | POA: Diagnosis not present

## 2019-08-20 LAB — POCT GLYCOSYLATED HEMOGLOBIN (HGB A1C): Hemoglobin A1C: 6.8 % — AB (ref 4.0–5.6)

## 2019-08-20 MED ORDER — TRULICITY 3 MG/0.5ML ~~LOC~~ SOAJ: 3.0000 mg | SUBCUTANEOUS | 3 refills | Status: DC

## 2019-08-20 NOTE — Addendum Note (Signed)
Addended by: Darliss Ridgel I on: 08/20/2019 11:03 AM   Modules accepted: Orders

## 2019-08-20 NOTE — Patient Instructions (Signed)
Please continue: - Metformin 2000 mg with dinner - Glipizide 5 mg 2X a day before meals - Tresiba 12 units daily  Please increase: - Trulicity 3 mg weekly  Continue: - Rosuvastatin 5 mg daily  Please return in 4 months with your sugar log.

## 2019-08-20 NOTE — Progress Notes (Signed)
Patient ID: Shelly Morgan, female   DOB: 16-Feb-1961, 59 y.o.   MRN: [id number]   This visit occurred during the SARS-CoV-2 public health emergency.  Safety protocols were in place, including screening questions prior to the visit, additional usage of staff PPE, and extensive cleaning of exam room while observing appropriate contact time as indicated for disinfecting solutions.   HPI: Shelly Morgan is a 59 y.o.-year-old female, initially referred by her PCP, Manon Hilding, PA, for management of DM2, dx in 2008, GDM initially in 2002, insulin-dependent, uncontrolled, without long term complications. Last visit 4 months ago.  She continues on a more plant-based diet and also does intermittent fasting.  However, she feels that whenever she eats fasting in the evening her sugars increase in the morning.  Reviewed HbA1c levels: Lab Results  Component Value Date   HGBA1C 6.5 (A) 04/21/2019   HGBA1C 7.0 (A) 12/15/2018   HGBA1C 8.4 (A) 08/25/2018   HGBA1C 7.4 (A) 12/02/2017   HGBA1C 6.8 09/01/2017   HGBA1C 6.7 05/28/2017   HGBA1C 6.6 02/06/2017   HGBA1C 6.6 11/01/2016   HGBA1C 6.4 06/25/2016   HGBA1C 7.2 02/02/2016   HGBA1C 8.3 10/23/2015  01/25/2016: 7.4% 04/24/2015: HbA1c 7.9% 04/21/2014: HbA1c 6.9%  She is on: - Metformin 2000 mg with dinner - Glipizide 5 mg 2X a day before meals - Trulicity 1.5 mg weekly - Tresiba 14 >> 12 units daily  - restarted 08/2018 (she was off insulin due to weight gain in the past) She was on Jardiance 25 mg before b'fast. - started 03/2016 - had 1 yeast inf., but continued to have labial itching >> on a steroid cream + Azo cream 3x a day >> but this did not help so she stopped Jardiance. She tried Trulicity before (2017) and developed nausea and GERD.  No side effects now. Victoza was not covered.  Pt checks her sugars 1-2 times a day per review of her log: - am: 92-155, 174, 184 >> 103-140, 176 (forgot insulin), 182 >> 101-137, 171 - 2h after b'fast:   201, 214 >> n/c > 89-168 >> 111, 117 - before lunch:166, 175 >> n/c >> 107-130 >> 89, 163 - 2h after lunch:124, 160-216 >> n/c >> 114-121 >> 132-171 - before dinner: 145-187 >> n/c >> 90-127 >> 98-143 - 2h after dinner:148-178 >> n/c >> 82-137 >> 128-152 - bedtime: 168, 169 >> n/c >> 95-139, 181 >> 132-174 - nighttime:  N/c >> 135 >> n/c >> 118 Lowest sugar was 124 >> 92 >> 87; she has hypoglycemia awareness in the 90s. Highest sugar was 25 >> 287 >> 182.  Glucometer: One Touch Verio  Pt's meals are: - Breakfast: Enterex or Eggs  + 1/2 Bagel >> smoothie - snack: almonds - Lunch: 2 hard boiled eggs + veggies + fruit cup >> veggies + fruit cups + nuts + yoghurt - Dinner: chicken or pork + veggies or salad  >> chicken + salad or veggies; chinese food - Snacks: 2: cottage cheese, fruit, veggies, nuts >> occas. Almonds, veggies Drinks: 1 diet sodas a day (Dr Reino Kent).  She has seen nutrition several time in the past.  No CKD, last BUN/creatinine:  Lab Results  Component Value Date   BUN 12 04/21/2019   BUN 19 06/25/2016   CREATININE 1.02 04/21/2019   CREATININE 1.03 06/25/2016  07/27/2018: 18/1.04, GFR 61 05/15/2017: CMP normal, except glucose 171.  BUN/creatinine 20/1.01, GFR 56. Not on ACE inhibitor/ARB. + HL latest Lipids  Lab Results  Component Value Date  CHOL 144 04/21/2019   HDL 45.50 04/21/2019   LDLCALC 72 04/21/2019   TRIG 130.0 04/21/2019   CHOLHDL 3 04/21/2019  07/27/2018: 215/244/45/121 05/15/2017: 189/202/47/102  Previously on Lipitor, and then off.  We started Crestor last year. - last eye exam was 03/2019: No DR - no numbness and tingling in her feet. On ASA 81.  Daughter studies dancing in [place] - started the Fall of 2020.  ROS: Constitutional: no weight gain/no weight loss, no fatigue, no subjective hyperthermia, no subjective hypothermia Eyes: no blurry vision, no xerophthalmia ENT: no sore throat, no nodules palpated in neck, no dysphagia, no  odynophagia, no hoarseness Cardiovascular: no CP/no SOB/no palpitations/no leg swelling Respiratory: no cough/no SOB/no wheezing Gastrointestinal: no N/no V/no D/no C/no acid reflux Musculoskeletal: no muscle aches/no joint aches Skin: no rashes, no hair loss Neurological: no tremors/no numbness/no tingling/no dizziness  I reviewed pt's medications, allergies, PMH, social hx, family hx, and changes were documented in the history of present illness. Otherwise, unchanged from my initial visit note.  Past Medical History:  Diagnosis Date  . Diabetes mellitus without complication [facility])    Social History   Social History  . Marital status: Married    Spouse name: N/A  . Number of children: 1   Occupational History  . 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    Social History Main Topics  . Smoking status: Former Smoker    Types: Cigarettes    Quit date: 1997  . Smokeless tobacco: Not on file  . Alcohol use Yes     Comment: 1-2 year  . Drug use: no   Current Outpatient Medications on File Prior to Visit  Medication Sig Dispense Refill  . BD PEN NEEDLE NANO 2ND GEN 32G X 4 MM MISC Please specify directions, refills and quantity 200 each 2  . diphenhydrAMINE (BENADRYL) 25 mg capsule Take 25 mg by mouth 2 (two) times daily.    Marland Kitchen glipiZIDE (GLUCOTROL) 5 MG tablet TAKE 1 TABLET BY MOUTH TWICE A DAY BEFORE A MEAL 180 tablet 3  . insulin degludec (TRESIBA FLEXTOUCH) 100 UNIT/ML SOPN FlexTouch Pen Inject 10 units every night 5 pen 4  . Lancets (ONETOUCH DELICA PLUS LANCET33G) MISC USE AS DIRECTED TO TEST TWICE DAILY 200 each 3  . metFORMIN (GLUCOPHAGE) 1000 MG tablet Take 2 tablets daily with dinner. 180 tablet 2  . ONETOUCH VERIO test strip USE AS DIRECTED TO TEST TWICE DAILY 200 strip 3  . rosuvastatin (CRESTOR) 5 MG tablet TAKE 1 TABLET BY MOUTH EVERY DAY 90 tablet 1  . TRULICITY 1.5 MG/0.5ML SOPN INJECT 1  PEN UNDER SKIN WEEKLY 6 pen 3   No current facility-administered medications on file prior to visit.   Allergies  Allergen Reactions  . Codeine Nausea And Vomiting  . Morphine And Related Nausea And Vomiting   Family History  Problem Relation Age of Onset  . COPD Mother   . Diverticulitis Father   . Crohn's disease Brother    PGF and F with DM.  PE: BP 120/60   Pulse 78   Ht 5\' 8"  (1.727 m) Comment: measured  Wt 218 lb (98.9 kg)   SpO2 98%   BMI 33.15 kg/m  Wt Readings from Last 3 Encounters:  08/20/19 218 lb (98.9 kg)  04/21/19 216 lb (98 kg)  12/15/18 221 lb (100.2 kg)   Constitutional: overweight, in NAD Eyes: PERRLA, EOMI, no exophthalmos ENT:  moist mucous membranes, no thyromegaly, no cervical lymphadenopathy Cardiovascular: RRR, No MRG Respiratory: CTA B Gastrointestinal: abdomen soft, NT, ND, BS+ Musculoskeletal: no deformities, strength intact in all 4 Skin: moist, warm, no rashes Neurological: no tremor with outstretched hands, DTR normal in all 4  ASSESSMENT: 1. DM2, insulin-dependent, uncontrolled, without long term complications, but with hyperglycemia  In 2019, we ruled out type 1 diabetes: Component     Latest Ref Rng & Units 09/01/2017  Hemoglobin A1C      6.8  C-Peptide     0.80 - 3.85 ng/mL 2.87  Islet Cell Ab     Neg:<1:1 Negative  Glutamic Acid Decarb Ab     <5 IU/mL <5  Glucose, Plasma     65 - 99 mg/dL 756 (H)  ZNT8 Antibodies     U/mL <15   2. Obesity class 1 BMI Classification:  < 18.5 underweight   18.5-24.9 normal weight   25.0-29.9 overweight   30.0-34.9 class I obesity   35.0-39.9 class II obesity   ? 40.0 class III obesity   3. HL  PLAN:  1. Patient with longstanding, uncontrolled, type 2 diabetes, on oral antidiabetic regimen with Metformin and sulfonylurea, also daily long-acting insulin and weekly GLP-1 receptor agonist.  Of note, she had been on SGLT2 the entire but could not tolerate it due to vaginal itching  despite using medicated creams.  She is tolerating the GLP-1 receptor agonist well, without GI symptoms. -At last visit, sugars improved further as she continued on intermittent fasting diet.  HbA1c was 6.5%, improved.  She only had occasional mild hypoglycemic spikes, sometimes after dietary indiscretions but sometimes without a clear reason.  Occasionally, she was forgetting her [place] and we discussed that she could take this at anytime of the day.  We did not change her regimen at that time but we discussed about possibly reducing the insulin dose and maybe even the glipizide in the near future. -At this visit, her sugars slightly higher than before.  They are mostly at goal, but she has more hypoglycemic spikes.  She tells me that even when she is fasting she feels that her sugars are increasing, may be even more than if she is eating dinner.  We discussed about possible mechanisms for this.  For now, I would suggest to increase Trulicity since she is tolerating it well.  For now, we will continue the rest of the regimen but after she starts the higher dose of Trulicity, we may be able to reduce the insulin and/or sulfonylurea dose. - I suggested to:  Patient Instructions  Please continue: - Metformin 2000 mg with dinner - Glipizide 5 mg 2X a day before meals - Tresiba 12 units daily  Please increase: - Trulicity 3 mg weekly  Continue: - Rosuvastatin 5 mg daily  Please return in 4 months with your sugar log.   - we checked her HbA1c: 6.8% (higher) - advised to check sugars at different times of the day - 1-2x a day, rotating check times - advised for yearly eye exams >> she is UTD - return to clinic in 3-4 months    2. Obesity class 1 -She gained weight in the past on insulin, however, unfortunately, we need to continue this for now -She lost 5 pounds before last visit, gained 2 back since then -We will continue her GLP-1 receptor agonist, which should also help with weight loss  3.  HL -Reviewed latest lipid panel from last visit: Excellent improvement, all fractions  at goal: Lab Results  Component Value Date   CHOL 144 04/21/2019   HDL 45.50 04/21/2019   LDLCALC 72 04/21/2019   TRIG 130.0 04/21/2019   CHOLHDL 3 04/21/2019  -Continues Crestor 5 mg daily   Philemon Kingdom, MD PhD [facility] Endocrinology

## 2019-12-20 ENCOUNTER — Other Ambulatory Visit: Payer: Self-pay

## 2019-12-20 ENCOUNTER — Encounter: Payer: Self-pay | Admitting: Internal Medicine

## 2019-12-20 ENCOUNTER — Ambulatory Visit: Payer: BC Managed Care – PPO | Admitting: Internal Medicine

## 2019-12-20 VITALS — BP 120/78 | HR 80 | Ht 68.0 in | Wt 213.0 lb

## 2019-12-20 DIAGNOSIS — Z6832 Body mass index (BMI) 32.0-32.9, adult: Secondary | ICD-10-CM | POA: Diagnosis not present

## 2019-12-20 DIAGNOSIS — E1165 Type 2 diabetes mellitus with hyperglycemia: Secondary | ICD-10-CM | POA: Diagnosis not present

## 2019-12-20 DIAGNOSIS — E785 Hyperlipidemia, unspecified: Secondary | ICD-10-CM

## 2019-12-20 DIAGNOSIS — E669 Obesity, unspecified: Secondary | ICD-10-CM | POA: Diagnosis not present

## 2019-12-20 LAB — POCT GLYCOSYLATED HEMOGLOBIN (HGB A1C): Hemoglobin A1C: 7.1 % — AB (ref 4.0–5.6)

## 2019-12-20 MED ORDER — TRESIBA FLEXTOUCH 100 UNIT/ML ~~LOC~~ SOPN: PEN_INJECTOR | SUBCUTANEOUS | 4 refills | Status: DC

## 2019-12-20 NOTE — Patient Instructions (Signed)
Please continue: - Metformin 2000 mg with dinner - Glipizide 5 mg 2X a day before meals - Trulicity 3 mg weekly  Please increase: - Tresiba 14 units daily  Continue: - Rosuvastatin 5 mg daily  Please return in 4 months with your sugar log.

## 2019-12-20 NOTE — Addendum Note (Signed)
Addended by: Darliss Ridgel I on: 12/20/2019 10:41 AM   Modules accepted: Orders

## 2019-12-20 NOTE — Progress Notes (Signed)
Patient ID: Shelly Morgan, female   DOB: [birth year], 59 y.o.   MRN: [id number]   This visit occurred during the SARS-CoV-2 public health emergency.  Safety protocols were in place, including screening questions prior to the visit, additional usage of staff PPE, and extensive cleaning of exam room while observing appropriate contact time as indicated for disinfecting solutions.   HPI: Shelly Morgan is a 59 y.o.-year-old female, initially referred by her PCP, Manon Hilding, PA, for management of DM2, dx in 2008, GDM initially in 2002, insulin-dependent, uncontrolled, without long term complications. Last visit 4 months ago.  She continues on a more plant-based diet and also does intermittent fasting.  Reviewed HbA1c levels: Lab Results  Component Value Date   HGBA1C 6.8 (A) 08/20/2019   HGBA1C 6.5 (A) 04/21/2019   HGBA1C 7.0 (A) 12/15/2018   HGBA1C 8.4 (A) 08/25/2018   HGBA1C 7.4 (A) 12/02/2017   HGBA1C 6.8 09/01/2017   HGBA1C 6.7 05/28/2017   HGBA1C 6.6 02/06/2017   HGBA1C 6.6 11/01/2016   HGBA1C 6.4 06/25/2016   HGBA1C 7.2 02/02/2016   HGBA1C 8.3 10/23/2015  01/25/2016: 7.4% 04/24/2015: HbA1c 7.9% 04/21/2014: HbA1c 6.9%  She is on: - Metformin 2000 mg with dinner - Glipizide 5 mg 2X a day before meals - Trulicity 1.5 >> 3 mg weekly - Tresiba 14 >> 12 units daily  - restarted 08/2018 (she was off insulin due to weight gain in the past) She was on Jardiance 25 mg before b'fast. - started 03/2016 - had 1 yeast inf., but continued to have labial itching >> on a steroid cream + Azo cream 3x a day >> but this did not help so she stopped Jardiance. She tried Trulicity before (2017) and developed nausea and GERD.  No side effects now. Victoza was not covered.  Pt checks her sugars 1-2 times a day per review of her log: - am: 103-140, 176 (forgot insulin), 182 >> 101-137, 171 >> 107-147, 170 - 2h after b'fast:  201, 214 >> n/c > 89-168 >> 111, 117 >> 136-144 - before lunch:166, 175  >> n/c >> 107-130 >> 89, 163 >> 80, 91-98 - 2h after lunch:124, 160-216 >> n/c >> 114-121 >> 132-171 >> 127-134 - before dinner: 145-187 >> n/c >> 90-127 >> 98-143 >> 107-155 - 2h after dinner:148-178 >> n/c >> 82-137 >> 128-152 >> 134-142 - bedtime: 168, 169 >> n/c >> 95-139, 181 >> 132-174 >> 100-107 - nighttime:  N/c >> 135 >> n/c >> 118 >> 96 Lowest sugar was 124 >> 92 >> 87 >> 80; she has hypoglycemia awareness in the 90s. Highest sugar was 25 >> 287 >> 182 >> 170.  Glucometer: One Touch Verio  Pt's meals are: - Breakfast: Enterex or Eggs  + 1/2 Bagel >> smoothie - snack: almonds - Lunch: 2 hard boiled eggs + veggies + fruit cup >> veggies + fruit cups + nuts + yoghurt - Dinner: chicken or pork + veggies or salad  >> chicken + salad or veggies; chinese food - Snacks: 2: cottage cheese, fruit, veggies, nuts >> occas. Almonds, veggies Drinks: 1 diet sodas a day (Dr Reino Kent).  She has seen nutrition in the past.  No CKD, last BUN/creatinine:  Lab Results  Component Value Date   BUN 12 04/21/2019   BUN 19 06/25/2016   CREATININE 1.02 04/21/2019   CREATININE 1.03 06/25/2016  07/27/2018: 18/1.04, GFR 61 05/15/2017: CMP normal, except glucose 171.  BUN/creatinine 20/1.01, GFR 56. Not on ACE inhibitor/ARB. + HL; latest Lipids  Lab Results  Component Value Date   CHOL 144 04/21/2019   HDL 45.50 04/21/2019   LDLCALC 72 04/21/2019   TRIG 130.0 04/21/2019   CHOLHDL 3 04/21/2019  07/27/2018: 215/244/45/121 05/15/2017: 189/202/47/102  Previously on Lipitor.  We started Crestor in 2020. - last eye exam was 03/2019: No DR -No numbness and tingling in her feet. On ASA 81.  Daughter studies dancing in [place] - started the Fall of 2020.  ROS: Constitutional: no weight gain/+ weight loss, no fatigue, no subjective hyperthermia, no subjective hypothermia Eyes: no blurry vision, no xerophthalmia ENT: no sore throat, no nodules palpated in neck, no dysphagia, no odynophagia, no  hoarseness Cardiovascular: no CP/no SOB/no palpitations/no leg swelling Respiratory: no cough/no SOB/no wheezing Gastrointestinal: no N/no V/no D/no C/no acid reflux Musculoskeletal: no muscle aches/no joint aches Skin: no rashes, no hair loss Neurological: no tremors/no numbness/no tingling/no dizziness  I reviewed pt's medications, allergies, PMH, social hx, family hx, and changes were documented in the history of present illness. Otherwise, unchanged from my initial visit note.  Past Medical History:  Diagnosis Date  . Diabetes mellitus without complication [facility])    Social History   Social History  . Marital status: Married    Spouse name: N/A  . Number of children: 1   Occupational History  . 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    Social History Main Topics  . Smoking status: Former Smoker    Types: Cigarettes    Quit date: 1997  . Smokeless tobacco: Not on file  . Alcohol use Yes     Comment: 1-2 year  . Drug use: no   Current Outpatient Medications on File Prior to Visit  Medication Sig Dispense Refill  . BD PEN NEEDLE NANO 2ND GEN 32G X 4 MM MISC Please specify directions, refills and quantity 200 each 2  . diphenhydrAMINE (BENADRYL) 25 mg capsule Take 25 mg by mouth 2 (two) times daily.    . Dulaglutide (TRULICITY) 3 MG/0.5ML SOPN Inject 3 mg into the skin once a week. 12 pen 3  . glipiZIDE (GLUCOTROL) 5 MG tablet TAKE 1 TABLET BY MOUTH TWICE A DAY BEFORE A MEAL 180 tablet 3  . insulin degludec (TRESIBA FLEXTOUCH) 100 UNIT/ML SOPN FlexTouch Pen Inject 10 units every night 5 pen 4  . Lancets (ONETOUCH DELICA PLUS LANCET33G) MISC USE AS DIRECTED TO TEST TWICE DAILY 200 each 3  . metFORMIN (GLUCOPHAGE) 1000 MG tablet Take 2 tablets daily with dinner. 180 tablet 2  . ONETOUCH VERIO test strip USE AS DIRECTED TO TEST TWICE DAILY 200 strip 3  . rosuvastatin (CRESTOR) 5 MG tablet TAKE 1  TABLET BY MOUTH EVERY DAY 90 tablet 1   No current facility-administered medications on file prior to visit.   Allergies  Allergen Reactions  . Codeine Nausea And Vomiting  . Morphine And Related Nausea And Vomiting   Family History  Problem Relation Age of Onset  . COPD Mother   . Diverticulitis Father   . Crohn's disease Brother    PGF and F with DM.  PE: BP 120/78   Pulse 80   Ht 5\' 8"  (1.727 m)   Wt 213 lb (96.6 kg)   SpO2 98%   BMI 32.39 kg/m  Wt Readings from Last 3 Encounters:  12/20/19 213 lb (96.6 kg)  08/20/19 218 lb (98.9 kg)  04/21/19 216 lb (98 kg)   Constitutional: overweight, in  NAD Eyes: PERRLA, EOMI, no exophthalmos ENT: moist mucous membranes, no thyromegaly, no cervical lymphadenopathy Cardiovascular: RRR, No MRG Respiratory: CTA B Gastrointestinal: abdomen soft, NT, ND, BS+ Musculoskeletal: no deformities, strength intact in all 4 Skin: moist, warm, no rashes Neurological: no tremor with outstretched hands, DTR normal in all 4  ASSESSMENT: 1. DM2, insulin-dependent, uncontrolled, without long term complications, but with hyperglycemia  In 2019, we ruled out type 1 diabetes: Component     Latest Ref Rng & Units 09/01/2017  Hemoglobin A1C      6.8  C-Peptide     0.80 - 3.85 ng/mL 2.87  Islet Cell Ab     Neg:<1:1 Negative  Glutamic Acid Decarb Ab     <5 IU/mL <5  Glucose, Plasma     65 - 99 mg/dL 544 (H)  ZNT8 Antibodies     U/mL <15   2. Obesity class 1 BMI Classification:  < 18.5 underweight   18.5-24.9 normal weight   25.0-29.9 overweight   30.0-34.9 class I obesity   35.0-39.9 class II obesity   ? 40.0 class III obesity   3. HL  PLAN:  1. Patient with longstanding, uncontrolled, type 2 diabetes, on oral antidiabetic regimen with Metformin and sulfonylurea, also, daily long-acting insulin and weekly GLP-1 receptor agonist.  Of note, she has been on an SGLT2 inhibitor in the past but could not tolerate it due to vaginal  itching despite using medicated creams.  No GI side effects with a GLP-1 receptor agonist.  At last visit, we increased the dose.  At that time, HbA1c was slightly higher, at 6.8% and her sugars were slightly higher than before, with more hyperglycemic spikes.  She was telling me that even when she was fasting, she felt her sugars were increasing and we discussed about possible mechanisms for this. -At this visit, sugars are slightly higher, but still at close to goal, without significant hyperglycemic spikes or any hypoglycemic values.  Sugars in the morning are higher than target many times, occasionally in the 150s to 170, but these are rare occurrences.  Since the majority of the sugars are still in the 130s to 140 range, we discussed about increasing the dose of Tresiba slightly.  Otherwise, we will continue the rest of the regimen. - I suggested to:  Patient Instructions  Please continue: - Metformin 2000 mg with dinner - Glipizide 5 mg 2X a day before meals - Trulicity 3 mg weekly  Please increase: - Tresiba 14 units daily  Continue: - Rosuvastatin 5 mg daily  Please return in 4 months with your sugar log.   - we checked her HbA1c: 7.1% (higher, but this is slightly discrepant with her blood sugars at home) - advised to check sugars at different times of the day - 1-2x a day, rotating check times - advised for yearly eye exams >> she is UTD - return to clinic in 4 months    2. Obesity class 1 -We will continue her GLP-1 receptor agonist, which should also help with weight loss.  We increased her Trulicity at last visit. -She continues intermittent fasting - she lost 5 lbs since last visit  3. HL -Reviewed latest lipid panel from 04/2019: Fractions at goal: Lab Results  Component Value Date   CHOL 144 04/21/2019   HDL 45.50 04/21/2019   LDLCALC 72 04/21/2019   TRIG 130.0 04/21/2019   CHOLHDL 3 04/21/2019  -Continues Crestor 5 mg daily   Carlus Pavlov, MD PhD [facility]  Endocrinology

## 2020-01-25 ENCOUNTER — Other Ambulatory Visit: Payer: Self-pay | Admitting: Internal Medicine

## 2020-01-27 ENCOUNTER — Other Ambulatory Visit: Payer: Self-pay | Admitting: Internal Medicine

## 2020-02-01 LAB — HM DIABETES EYE EXAM

## 2020-02-09 ENCOUNTER — Other Ambulatory Visit: Payer: Self-pay | Admitting: Internal Medicine

## 2020-03-16 LAB — HM DIABETES EYE EXAM

## 2020-04-26 ENCOUNTER — Ambulatory Visit: Payer: BC Managed Care – PPO | Admitting: Internal Medicine

## 2020-04-26 ENCOUNTER — Other Ambulatory Visit: Payer: Self-pay | Admitting: Internal Medicine

## 2020-04-26 ENCOUNTER — Encounter: Payer: Self-pay | Admitting: Internal Medicine

## 2020-04-26 ENCOUNTER — Other Ambulatory Visit: Payer: Self-pay

## 2020-04-26 VITALS — BP 132/84 | HR 94 | Ht 68.0 in | Wt 204.2 lb

## 2020-04-26 DIAGNOSIS — E669 Obesity, unspecified: Secondary | ICD-10-CM

## 2020-04-26 DIAGNOSIS — E1165 Type 2 diabetes mellitus with hyperglycemia: Secondary | ICD-10-CM

## 2020-04-26 DIAGNOSIS — Z23 Encounter for immunization: Secondary | ICD-10-CM | POA: Diagnosis not present

## 2020-04-26 DIAGNOSIS — E785 Hyperlipidemia, unspecified: Secondary | ICD-10-CM | POA: Diagnosis not present

## 2020-04-26 DIAGNOSIS — Z6832 Body mass index (BMI) 32.0-32.9, adult: Secondary | ICD-10-CM

## 2020-04-26 LAB — POCT GLYCOSYLATED HEMOGLOBIN (HGB A1C): Hemoglobin A1C: 6.5 % — AB (ref 4.0–5.6)

## 2020-04-26 NOTE — Addendum Note (Signed)
Addended by: Kenyon Ana on: 04/26/2020 08:45 AM   Modules accepted: Orders

## 2020-04-26 NOTE — Patient Instructions (Addendum)
Please continue: - Metformin 2000 mg with dinner - Glipizide 5 mg 2X a day before meals - Trulicity 3 mg weekly  Please increase: - Tresiba 14-16 units daily  Please return in 4 months with your sugar log.

## 2020-04-26 NOTE — Progress Notes (Signed)
Patient ID: Shelly Morgan, female   DOB: 04/06/1961, 59 y.o.   MRN: [id number]   This visit occurred during the SARS-CoV-2 public health emergency.  Safety protocols were in place, including screening questions prior to the visit, additional usage of staff PPE, and extensive cleaning of exam room while observing appropriate contact time as indicated for disinfecting solutions.   HPI: Shelly Morgan is a 59 y.o.-year-old female, initially referred by her PCP, Manon Hilding, PA, for management of DM2, dx in 2008, GDM initially in 2002, insulin-dependent, uncontrolled, without long term complications. Last visit 4 months ago.  She continues on a more plant-based diet and also does intermittent fasting. She lost 9 more pounds since last visit.  Reviewed HbA1c levels: Lab Results  Component Value Date   HGBA1C 7.1 (A) 12/20/2019   HGBA1C 6.8 (A) 08/20/2019   HGBA1C 6.5 (A) 04/21/2019   HGBA1C 7.0 (A) 12/15/2018   HGBA1C 8.4 (A) 08/25/2018   HGBA1C 7.4 (A) 12/02/2017   HGBA1C 6.8 09/01/2017   HGBA1C 6.7 05/28/2017   HGBA1C 6.6 02/06/2017   HGBA1C 6.6 11/01/2016   HGBA1C 6.4 06/25/2016   HGBA1C 7.2 02/02/2016   HGBA1C 8.3 10/23/2015  01/25/2016: 7.4% 04/24/2015: HbA1c 7.9% 04/21/2014: HbA1c 6.9%  She is on: - Metformin 2000 mg with dinner - Glipizide 5 mg 2X a day before meals - Trulicity 1.5 >> 3 mg weekly - Tresiba 14 >> 12 units daily  - restarted 08/2018 (she was off insulin due to weight gain in the past) >> 14 units daily She was on Jardiance 25 mg before b'fast. - started 03/2016 - had 1 yeast inf., but continued to have labial itching >> on a steroid cream + Azo cream 3x a day >> but this did not help so she stopped Jardiance. She tried Trulicity before (2017) and developed nausea and GERD.  No side effects now. Victoza was not covered.  Pt checks her sugars 1-2 times a day per review of her log: - am:101-137, 171 >> 107-147, 170 >> 113-160 - 2h after b'fast: 89-168 >>  111, 117 >> 136-144 >> 79, 153 - before lunch: 107-130 >> 89, 163 >> 80, 91-98 >> 74-113 - 2h after lunch: 114-121 >> 132-171 >> 127-134 >> 127-186 - before dinner: 90-127 >> 98-143 >> 107-155 >> 86, 176 - 2h after dinner: 82-137 >> 128-152 >> 134-142 >> 79, 122, 162 - bedtime: 95-139, 181 >> 132-174 >> 100-107 >> see above - nighttime:  N/c >> 135 >> n/c >> 118 >> 96 > n/c Lowest sugar was 92 >> 87 >> 80 >> 74; she has hypoglycemia awareness in the 90s. Highest sugar was  287 >> 182 >> 170 >> 250 (banana).  Glucometer: One Touch Verio  Pt's meals are: - Breakfast: Enterex or Eggs  + 1/2 Bagel >> smoothie >> protein drink - snack: almonds - Lunch: 2 hard boiled eggs + veggies + fruit cup >> veggies + fruit cups + nuts + yoghurt - Dinner: chicken or pork + veggies or salad  >> chicken + salad or veggies; chinese food - Snacks: 2: cottage cheese, fruit, veggies, nuts >> occas. Almonds, veggies Drinks: 1 diet sodas a day (Dr Reino Kent).  She has seen nutrition in the past.   Mild CKD, last BUN/creatinine:  Lab Results  Component Value Date   BUN 12 04/21/2019   BUN 19 06/25/2016   CREATININE 1.02 04/21/2019   CREATININE 1.03 06/25/2016  07/27/2018: 18/1.04, GFR 61 05/15/2017: CMP normal, except glucose 171.  BUN/creatinine  20/1.01, GFR 56. Not on ACE inhibitor/ARB. + HL; latest Lipids: Lab Results  Component Value Date   CHOL 144 04/21/2019   HDL 45.50 04/21/2019   LDLCALC 72 04/21/2019   TRIG 130.0 04/21/2019   CHOLHDL 3 04/21/2019  07/27/2018: 215/244/45/121 05/15/2017: 189/202/47/102  She was previously on Lipitor.  We started Crestor 5 mg daily in 2020.  She continues on this.  No side effects. - last eye exam was 03/2020: No DR reportedly -She denies numbness and tingling in her feet. On ASA 81.  Daughter studies dancing in [place] - started the Fall of 2020.  ROS: Constitutional: no weight gain/+ weight loss, no fatigue, no subjective hyperthermia, no subjective  hypothermia Eyes: no blurry vision, no xerophthalmia ENT: no sore throat, no nodules palpated in neck, no dysphagia, no odynophagia, no hoarseness Cardiovascular: no CP/no SOB/no palpitations/no leg swelling Respiratory: no cough/no SOB/no wheezing Gastrointestinal: no N/no V/no D/no C/no acid reflux Musculoskeletal: no muscle aches/no joint aches Skin: no rashes, no hair loss Neurological: no tremors/no numbness/no tingling/no dizziness  I reviewed pt's medications, allergies, PMH, social hx, family hx, and changes were documented in the history of present illness. Otherwise, unchanged from my initial visit note.  Past Medical History:  Diagnosis Date  . Diabetes mellitus without complication [facility])    Social History   Social History  . Marital status: Married    Spouse name: N/A  . Number of children: 1   Occupational History  . 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    Social History Main Topics  . Smoking status: Former Smoker    Types: Cigarettes    Quit date: 1997  . Smokeless tobacco: Not on file  . Alcohol use Yes     Comment: 1-2 year  . Drug use: no   Current Outpatient Medications on File Prior to Visit  Medication Sig Dispense Refill  . BD PEN NEEDLE NANO 2ND GEN 32G X 4 MM MISC Please specify directions, refills and quantity 200 each 2  . diphenhydrAMINE (BENADRYL) 25 mg capsule Take 25 mg by mouth 2 (two) times daily.    . Dulaglutide (TRULICITY) 3 MG/0.5ML SOPN Inject 3 mg into the skin once a week. 12 pen 3  . glipiZIDE (GLUCOTROL) 5 MG tablet TAKE 1 TABLET BY MOUTH TWICE A DAY BEFORE A MEAL 180 tablet 3  . insulin degludec (TRESIBA FLEXTOUCH) 100 UNIT/ML FlexTouch Pen INJECT 10 UNITS EVERY NIGHT 15 mL 4  . Lancets (ONETOUCH DELICA PLUS LANCET33G) MISC USE AS DIRECTED TO TEST TWICE DAILY 200 each 3  . metFORMIN (GLUCOPHAGE) 1000 MG tablet Take 2 tablets daily with dinner. 180  tablet 2  . ONETOUCH VERIO test strip USE AS DIRECTED TO TEST TWICE DAILY 200 strip 3  . rosuvastatin (CRESTOR) 5 MG tablet TAKE 1 TABLET BY MOUTH EVERY DAY 90 tablet 1   No current facility-administered medications on file prior to visit.   Allergies  Allergen Reactions  . Codeine Nausea And Vomiting  . Morphine And Related Nausea And Vomiting   Family History  Problem Relation Age of Onset  . COPD Mother   . Diverticulitis Father   . Crohn's disease Brother    PGF and F with DM.  PE: BP 132/84   Pulse 94   Ht 5\' 8"  (1.727 m)   Wt 204 lb 3.2 oz (92.6 kg)   SpO2 95%   BMI 31.05 kg/m  Wt  Readings from Last 3 Encounters:  04/26/20 204 lb 3.2 oz (92.6 kg)  12/20/19 213 lb (96.6 kg)  08/20/19 218 lb (98.9 kg)   Constitutional: overweight, in NAD Eyes: PERRLA, EOMI, no exophthalmos ENT: moist mucous membranes, no thyromegaly, no cervical lymphadenopathy Cardiovascular: tachycardia, RR, No MRG Respiratory: CTA B Gastrointestinal: abdomen soft, NT, ND, BS+ Musculoskeletal: no deformities, strength intact in all 4 Skin: moist, warm, no rashes Neurological: no tremor with outstretched hands, DTR normal in all 4  ASSESSMENT: 1. DM2, insulin-dependent, uncontrolled, without long term complications, but with hyperglycemia  In 2019, we ruled out type 1 diabetes: Component     Latest Ref Rng & Units 09/01/2017  Hemoglobin A1C      6.8  C-Peptide     0.80 - 3.85 ng/mL 2.87  Islet Cell Ab     Neg:<1:1 Negative  Glutamic Acid Decarb Ab     <5 IU/mL <5  Glucose, Plasma     65 - 99 mg/dL 921 (H)  ZNT8 Antibodies     U/mL <15   2. Obesity class 1 BMI Classification:  < 18.5 underweight   18.5-24.9 normal weight   25.0-29.9 overweight   30.0-34.9 class I obesity   35.0-39.9 class II obesity   ? 40.0 class III obesity   3. HL  PLAN:  1. Patient with longstanding, uncontrolled, type 2 diabetes, on oral antidiabetic regimen with Metformin and sulfonylurea, also,  daily long-acting insulin and weekly GLP-1 receptor agonist, with good control at last visit with a slightly higher HbA1c in the expected from her CBGs at home (7.1%).  Previous HbA1c was 6.8%.  At last visit, sugars were at or close to goal, without significant hyper or hypoglycemic values.  Sugars in the morning were higher than target many times and rarely in the 150-170s.  We increased the [place] dose slightly but did not change the rest of her regimen. -Of note, in the past she was on an SGLT2 inhibitor but could not tolerate it due to vaginal itching despite using medicated creams.  No GI side effects from the GLP-1 receptor agonist. -At today's visit, sugars appear to be at or slightly above goal in the morning and they are mostly at goal later in the day with 3 exceptions. However, upon questioning, she did not increase the dose of Tresiba to 14 units at last visit, but was still using 12 and then 13 units and only starting to use 14 units in the last week. At this visit, since sugars are still above target, I advised her to vary between 14 and 16 units, especially during the holidays. However, we can leave the rest of the regimen the same. - I suggested to:  Patient Instructions  Please continue: - Metformin 2000 mg with dinner - Glipizide 5 mg 2X a day before meals - Trulicity 3 mg weekly  Please increase: - Tresiba 14-16 units daily  Please return in 4 months with your sugar log.   - we checked her HbA1c: 6.5% (better) - advised to check sugars at different times of the day - 1x a day, rotating check times - advised for yearly eye exams >> she is UTD - she is due for annual labs that she would get these with PCP-will call and make an appointment - return to clinic in 4 months   2. Obesity class 1 -We will continue her GLP-1 receptor agonist which should also help with weight loss -She continues intermittent fasting -She lost 5 pounds before  last visit and 9 more since then  3.  HL -Reviewed latest lipid panel from a year ago: All fractions at goal: Lab Results  Component Value Date   CHOL 144 04/21/2019   HDL 45.50 04/21/2019   LDLCALC 72 04/21/2019   TRIG 130.0 04/21/2019   CHOLHDL 3 04/21/2019  -Continues Crestor five without side effects -She is due for another lipid panel >> appt with PCP coming up  + Flu shot today.   Carlus Pavlovristina Adeena Bernabe, MD PhD [facility]

## 2020-05-15 ENCOUNTER — Other Ambulatory Visit: Payer: Self-pay | Admitting: Internal Medicine

## 2020-07-28 ENCOUNTER — Encounter: Payer: Self-pay | Admitting: Internal Medicine

## 2020-07-28 ENCOUNTER — Other Ambulatory Visit: Payer: Self-pay | Admitting: Internal Medicine

## 2020-07-28 NOTE — Progress Notes (Signed)
Received labs from PCP from 07/26/2020: HbA1c 6.5% Lipids: 132/129/53/56

## 2020-08-22 IMAGING — MG DIGITAL SCREENING BILATERAL MAMMOGRAM WITH TOMO AND CAD
6 of 12 series · 6 of 36 positions shown · non-contrast
Comparison: Previous exam(s).

CLINICAL DATA: Screening.

EXAM:
DIGITAL SCREENING BILATERAL MAMMOGRAM WITH TOMO AND CAD

[R MLO synth-2D (1 of 2)]
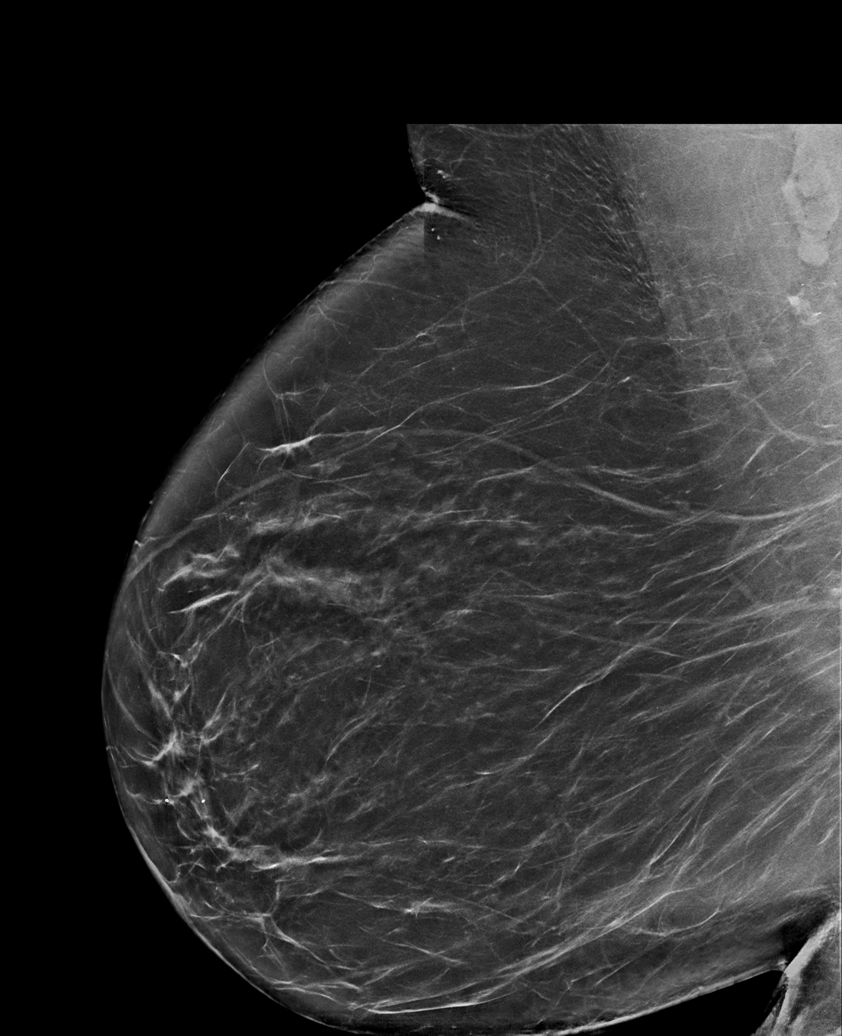

[R CC synth-2D]
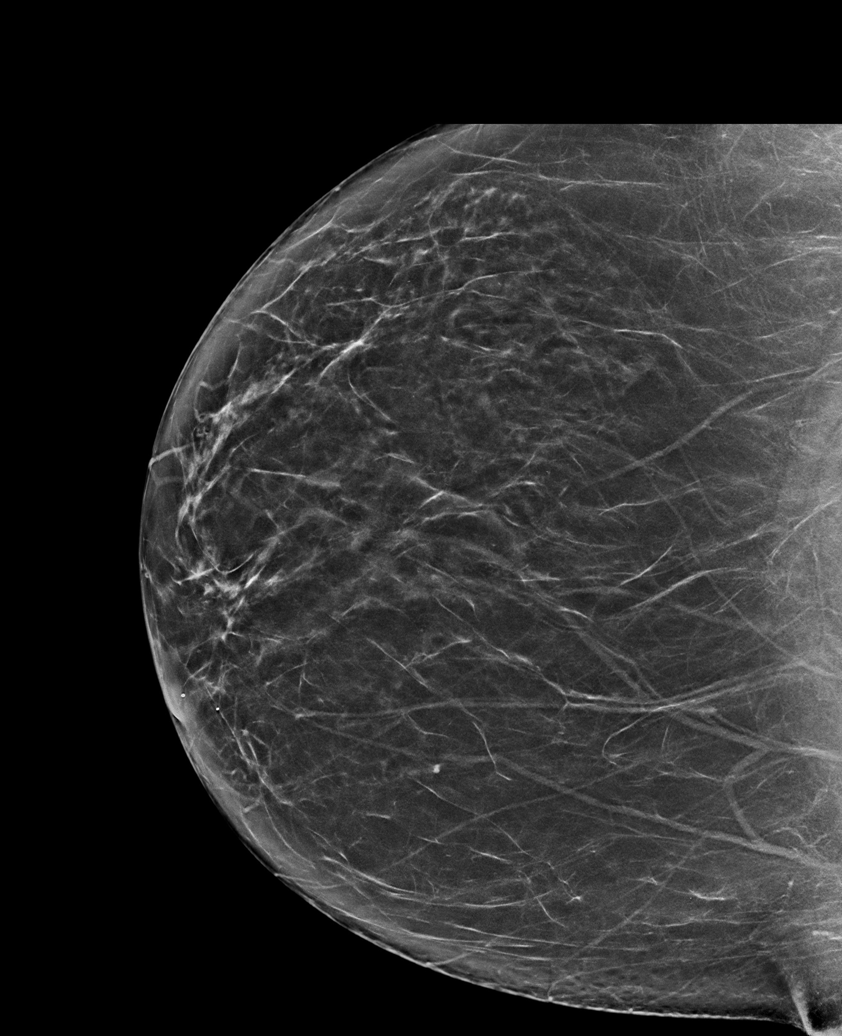

[L CC synth-2D (1 of 2)]
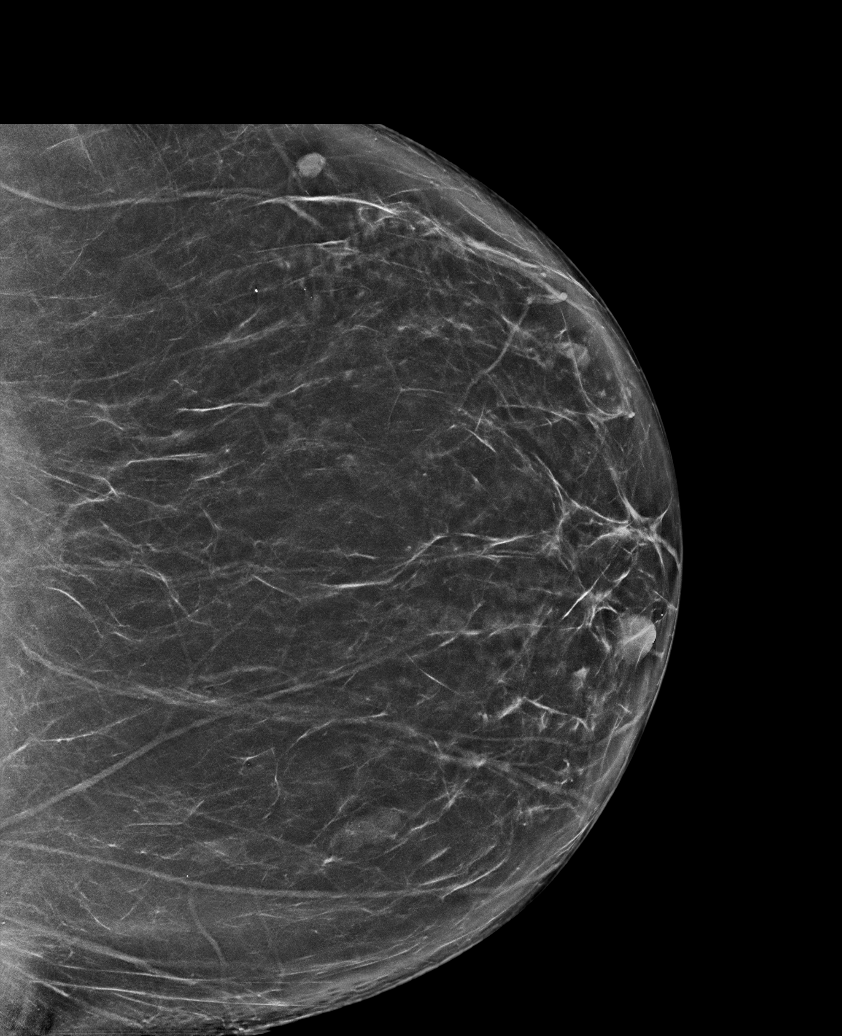

[R MLO synth-2D (2 of 2)]
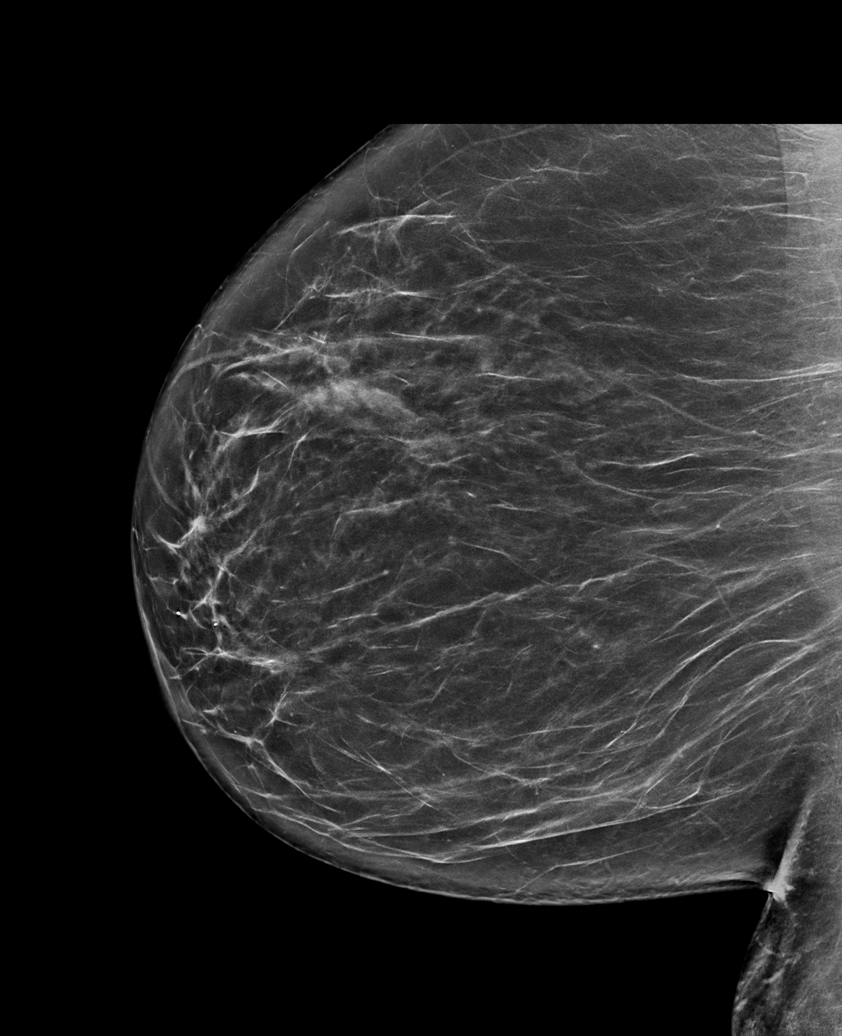

[L CC synth-2D (2 of 2)]
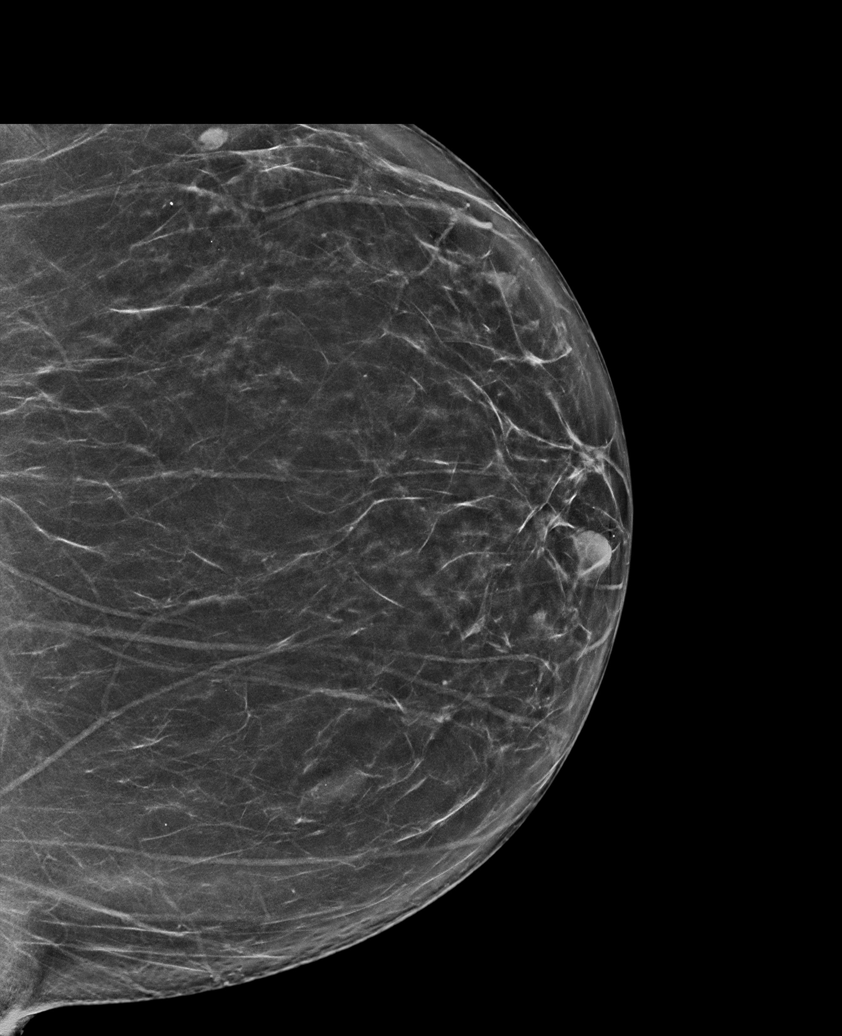

[L MLO synth-2D]
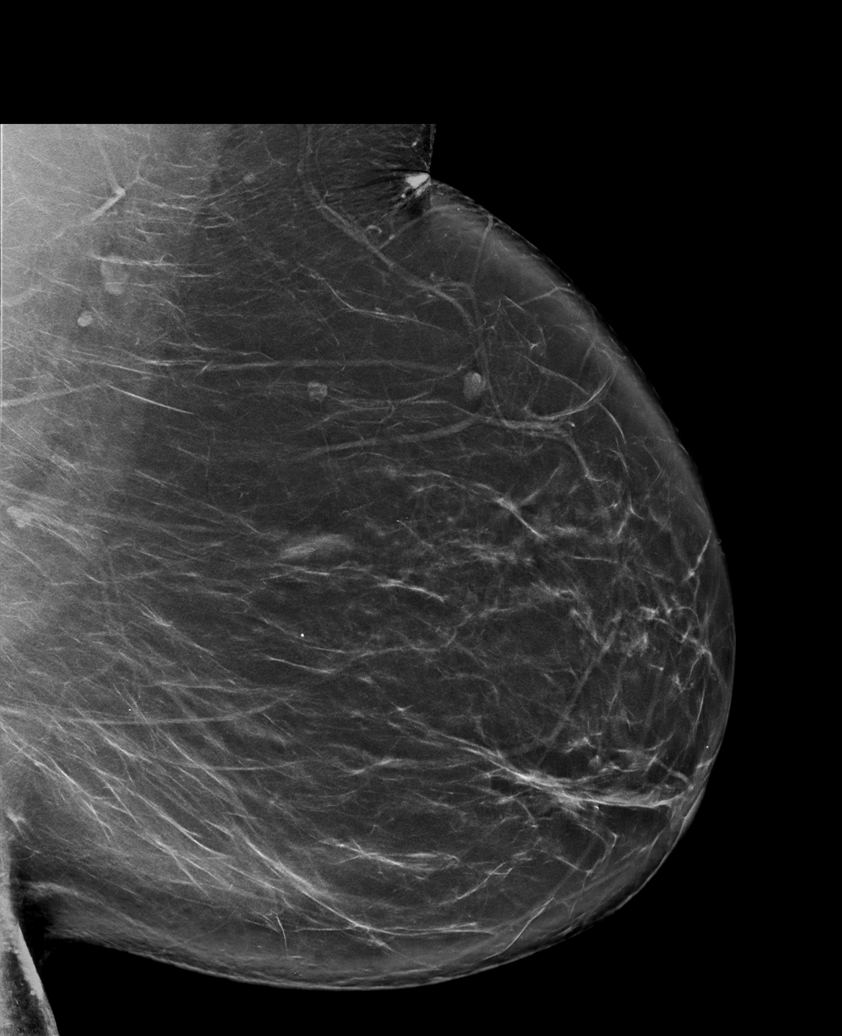

[6 of 36 positions shown; findings below may reference images not displayed]

ACR Breast Density Category b: There are scattered areas of
fibroglandular density.
FINDINGS: There are no findings suspicious for malignancy. Images were
processed with CAD.
IMPRESSION: No mammographic evidence of malignancy. A result letter of this
screening mammogram will be mailed directly to the patient.

RECOMMENDATION:
Screening mammogram in one year. (Code:[id number])

BI-RADS CATEGORY  1: Negative.

## 2020-09-01 ENCOUNTER — Encounter: Payer: Self-pay | Admitting: Internal Medicine

## 2020-09-01 ENCOUNTER — Other Ambulatory Visit: Payer: Self-pay

## 2020-09-01 ENCOUNTER — Ambulatory Visit: Payer: Self-pay | Admitting: Internal Medicine

## 2020-09-01 VITALS — BP 120/80 | HR 71 | Ht 68.0 in | Wt 191.6 lb

## 2020-09-01 DIAGNOSIS — E1165 Type 2 diabetes mellitus with hyperglycemia: Secondary | ICD-10-CM

## 2020-09-01 DIAGNOSIS — Z6832 Body mass index (BMI) 32.0-32.9, adult: Secondary | ICD-10-CM

## 2020-09-01 DIAGNOSIS — E669 Obesity, unspecified: Secondary | ICD-10-CM

## 2020-09-01 DIAGNOSIS — E785 Hyperlipidemia, unspecified: Secondary | ICD-10-CM

## 2020-09-01 MED ORDER — GLIPIZIDE 5 MG PO TABS
ORAL_TABLET | ORAL | 3 refills | Status: DC
Start: 2020-09-01 — End: 2021-01-17

## 2020-09-01 NOTE — Progress Notes (Signed)
Patient ID: Shelly Morgan, female   DOB: Jun 20, 1960, 60 y.o.   MRN: [id number]   This visit occurred during the SARS-CoV-2 public health emergency.  Safety protocols were in place, including screening questions prior to the visit, additional usage of staff PPE, and extensive cleaning of exam room while observing appropriate contact time as indicated for disinfecting solutions.   HPI: Shelly Morgan is a 60 y.o.-year-old female, initially referred by her PCP, Manon Hilding, PA, for management of DM2, dx in 2008, GDM initially in 2002, insulin-dependent, uncontrolled, without long term complications. Last visit 4 months ago.  Interim history: Before last visit, she started a more plant-based diet and intermittent fasting.  She lost 9 pounds before last visit.  She is not doing the intermittent fasting diet now, but reduced portions and started to go to the gym almost every day: yoga, zumba, tradmill. Lost 13 lbs since last OV.  She feels great  - no complaints at this visit.  Reviewed HbA1c levels: 07/26/2020: HbA1c 6.5% Lab Results  Component Value Date   HGBA1C 6.5 (A) 04/26/2020   HGBA1C 7.1 (A) 12/20/2019   HGBA1C 6.8 (A) 08/20/2019   HGBA1C 6.5 (A) 04/21/2019   HGBA1C 7.0 (A) 12/15/2018   HGBA1C 8.4 (A) 08/25/2018   HGBA1C 7.4 (A) 12/02/2017   HGBA1C 6.8 09/01/2017   HGBA1C 6.7 05/28/2017   HGBA1C 6.6 02/06/2017   HGBA1C 6.6 11/01/2016   HGBA1C 6.4 06/25/2016   HGBA1C 7.2 02/02/2016   HGBA1C 8.3 10/23/2015  01/25/2016: 7.4% 04/24/2015: HbA1c 7.9% 04/21/2014: HbA1c 6.9%  She is on: - Metformin 2000 mg with dinner - Glipizide 5 mg 2X a day before meals - Trulicity 1.5 >> 3 mg weekly - Tresiba 14 >> 12 units daily  - restarted 08/2018 (she was off insulin due to weight gain in the past) >> 14 >> 16 units daily She was on Jardiance 25 mg before b'fast. - started 03/2016 - had 1 yeast inf., but continued to have labial itching >> on a steroid cream + Azo cream 3x a day >> but  this did not help so she stopped Jardiance. She tried Trulicity before (2017) and developed nausea and GERD.  No side effects now. Victoza was not covered.  Pt checks her sugars 1-2 times a day per review of her log: - am: 107-147, 170 >> 113-160 >> 90-147, 150, 180 - 2h after b'fast: 136-144 >> 79, 153 >> 109-135 - before lunch: 80, 91-98 >> 74-113 >> 100-103 - 2h after lunch: 127-134 >> 127-186 >> 68-126, 141 - before dinner:  107-155 >> 86, 176 >> 97-114 - 2h after dinner: 134-142 >> 79, 122, 162 >> 97-139 - bedtime:  132-174 >> 100-107 >> 103-171 - nighttime:   135 >> n/c >> 118 >> 96 > n/c Lowest sugar was 92 >> 87 >> 80 >> 74 >> 68 x1; she has hypoglycemia awareness in the 90s. Highest sugar was  287 >> 182 >> 170 >> 250 (banana) >> 180.  Glucometer: One Touch Verio  Pt's meals are: - Breakfast: Enterex or Eggs  + 1/2 Bagel >> smoothie >> protein drink (Slim fast) - snack: almonds - Lunch: 2 hard boiled eggs + veggies + fruit cup >> veggies + fruit cups + nuts + yoghurt >> crackers + cheese - Dinner: chicken or pork + veggies or salad  >> chicken + salad or veggies; chinese food - Snacks: 2: cottage cheese, fruit, veggies, nuts >> occas. Almonds, veggies Drinks: 1 diet sodas a day (Dr  Pepper).  She has seen nutrition in the past.  Mild CKD, last BUN/creatinine:  07/26/2020: 16/0.88,GFR 65, Glu 77 Lab Results  Component Value Date   BUN 12 04/21/2019   BUN 19 06/25/2016   CREATININE 1.02 04/21/2019   CREATININE 1.03 06/25/2016  07/27/2018: 18/1.04, GFR 61 05/15/2017: CMP normal, except glucose 171.  BUN/creatinine 20/1.01, GFR 56. Not on ACE inhibitor/ARB.  + HL; latest Lipids: 07/26/2020: 132/129/53/56 Lab Results  Component Value Date   CHOL 144 04/21/2019   HDL 45.50 04/21/2019   LDLCALC 72 04/21/2019   TRIG 130.0 04/21/2019   CHOLHDL 3 04/21/2019  07/27/2018: 215/244/45/121 05/15/2017: 189/202/47/102  She was previously on Lipitor.  We started Crestor 5 mg  daily in 2020 >> no side effects  - last eye exam was 03/2020: No DR reportedly  - no numbness and tingling in her feet. On ASA 81.  Daughter studies dancing in [place] - started the Fall of 2020.  ROS: Constitutional: no weight gain/+ weight loss, no fatigue, no subjective hyperthermia, no subjective hypothermia Eyes: no blurry vision, no xerophthalmia ENT: no sore throat, no nodules palpated in neck, no dysphagia, no odynophagia, no hoarseness Cardiovascular: no CP/no SOB/no palpitations/no leg swelling Respiratory: no cough/no SOB/no wheezing Gastrointestinal: no N/no V/no D/no C/no acid reflux Musculoskeletal: no muscle aches/no joint aches Skin: no rashes, no hair loss Neurological: no tremors/no numbness/no tingling/no dizziness  I reviewed pt's medications, allergies, PMH, social hx, family hx, and changes were documented in the history of present illness. Otherwise, unchanged from my initial visit note.  Past Medical History:  Diagnosis Date  . Diabetes mellitus without complication [facility])    Social History   Social History  . Marital status: Married    Spouse name: N/A  . Number of children: 1   Occupational History  . 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    Social History Main Topics  . Smoking status: Former Smoker    Types: Cigarettes    Quit date: 1997  . Smokeless tobacco: Not on file  . Alcohol use Yes     Comment: 1-2 year  . Drug use: no   Current Outpatient Medications on File Prior to Visit  Medication Sig Dispense Refill  . BD PEN NEEDLE NANO 2ND GEN 32G X 4 MM MISC USE AS DIRECTED 200 each 2  . diphenhydrAMINE (BENADRYL) 25 mg capsule Take 25 mg by mouth 2 (two) times daily.    Marland Kitchen glipiZIDE (GLUCOTROL) 5 MG tablet TAKE 1 TABLET BY MOUTH TWICE A DAY BEFORE A MEAL 180 tablet 3  . insulin degludec (TRESIBA FLEXTOUCH) 100 UNIT/ML FlexTouch Pen INJECT 10 UNITS EVERY NIGHT  15 mL 4  . Lancets (ONETOUCH DELICA PLUS LANCET33G) MISC USE AS DIRECTED TO TEST TWICE DAILY 200 each 3  . metFORMIN (GLUCOPHAGE) 1000 MG tablet TAKE 2 TABLETS DAILY WITH DINNER. 180 tablet 2  . ONETOUCH VERIO test strip USE AS DIRECTED TO TEST TWICE DAILY 200 strip 3  . rosuvastatin (CRESTOR) 5 MG tablet TAKE 1 TABLET BY MOUTH EVERY DAY 90 tablet 1  . TRULICITY 3 MG/0.5ML SOPN INJECT 3 MG INTO THE SKIN ONCE A WEEK. 6 mL 3   No current facility-administered medications on file prior to visit.   Allergies  Allergen Reactions  . Codeine Nausea And Vomiting  . Morphine And Related Nausea And Vomiting   Family History  Problem Relation Age of Onset  . COPD  Mother   . Diverticulitis Father   . Crohn's disease Brother    PGF and F with DM.  PE: BP 120/80 (BP Location: Right Arm, Patient Position: Sitting, Cuff Size: Normal)   Pulse 71   Ht 5\' 8"  (1.727 m)   Wt 191 lb 9.6 oz (86.9 kg)   SpO2 97%   BMI 29.13 kg/m  Wt Readings from Last 3 Encounters:  09/01/20 191 lb 9.6 oz (86.9 kg)  04/26/20 204 lb 3.2 oz (92.6 kg)  12/20/19 213 lb (96.6 kg)   Constitutional: overweight, in NAD Eyes: PERRLA, EOMI, no exophthalmos ENT: moist mucous membranes, no thyromegaly, no cervical lymphadenopathy Cardiovascular: tachycardia, RR, No MRG Respiratory: CTA B Gastrointestinal: abdomen soft, NT, ND, BS+ Musculoskeletal: no deformities, strength intact in all 4 Skin: moist, warm, no rashes Neurological: no tremor with outstretched hands, DTR normal in all 4  ASSESSMENT: 1. DM2, insulin-dependent, uncontrolled, without long term complications, but with hyperglycemia  In 2019, we ruled out type 1 diabetes: Component     Latest Ref Rng & Units 09/01/2017  Hemoglobin A1C      6.8  C-Peptide     0.80 - 3.85 ng/mL 2.87  Islet Cell Ab     Neg:<1:1 Negative  Glutamic Acid Decarb Ab     <5 IU/mL <5  Glucose, Plasma     65 - 99 mg/dL 161172 (H)  ZNT8 Antibodies     U/mL <15   2. Obesity class  1 BMI Classification:  < 18.5 underweight   18.5-24.9 normal weight   25.0-29.9 overweight   30.0-34.9 class I obesity   35.0-39.9 class II obesity   ? 40.0 class III obesity   3. HL  PLAN:  1. Patient with longstanding, uncontrolled, type 2 diabetes, on oral antidiabetic regimen with Metformin and sulfonylurea, also daily long-acting insulin and weekly GLP-1 receptor agonist, with good control.  At last visit, HbA1c was 6.5% and she had another HbA1c checked 1 month ago and this was stable, at 6.5%. -Of note, she could not tolerate SGLT2 inhibitors due to vaginal itching despite using medicated creams.  She tolerates the GLP-1 receptor agonist well. -At last visit, sugars were slightly above goal in the morning but mostly at goal later in the day with few exceptions.  We increased her Tresiba dose slightly. -At today's visit, sugars are mostly at goal, but in the morning, she does have CBGs in the 130s to 140s range.  This has been a pattern that she had for a long time.  She had 1 high blood sugar at 180 in the morning.  For this reason, for now I would suggest to continue the same Tresiba dose, but we can eliminate her glipizide dose in the morning and only keep the dose before dinner.  I advised her to also experiment with using the glipizide only before a larger dinner.  For now, we will continue Metformin and Trulicity at the same doses. - I suggested to:  Patient Instructions  Please continue: - Metformin 2000 mg with dinner - Trulicity 3 mg weekly - Tresiba 16 units daily  Please decrease: - Glipizide 5 mg before dinner  Please return in 4 months with your sugar log  - advised to check sugars at different times of the day - 1-2x a day, rotating check times - advised for yearly eye exams >> she is UTD - return to clinic in 4 months   2. Obesity class 1 -We will continue GLP-1 receptor agonist which  should also help with weight loss -!She lost 13 pounds since last visit!  - Smaller portions, increased exercise.  3. HL -Reviewed latest lipid panel from 07/26/2020: All fractions at goal -Continues Crestor 5 without side effects   Carlus Pavlov, MD PhD [facility]

## 2020-09-01 NOTE — Patient Instructions (Addendum)
Please continue: - Metformin 2000 mg with dinner - Trulicity 3 mg weekly - Tresiba 16 units daily  Please decrease: - Glipizide 5 mg before dinner  Please return in 4 months with your sugar log

## 2021-01-17 ENCOUNTER — Ambulatory Visit: Payer: BC Managed Care – PPO | Admitting: Internal Medicine

## 2021-01-17 ENCOUNTER — Other Ambulatory Visit: Payer: Self-pay

## 2021-01-17 ENCOUNTER — Encounter: Payer: Self-pay | Admitting: Internal Medicine

## 2021-01-17 VITALS — BP 122/70 | HR 74 | Ht 68.0 in | Wt 181.6 lb

## 2021-01-17 DIAGNOSIS — E669 Obesity, unspecified: Secondary | ICD-10-CM

## 2021-01-17 DIAGNOSIS — E1165 Type 2 diabetes mellitus with hyperglycemia: Secondary | ICD-10-CM

## 2021-01-17 DIAGNOSIS — E785 Hyperlipidemia, unspecified: Secondary | ICD-10-CM | POA: Diagnosis not present

## 2021-01-17 DIAGNOSIS — Z6832 Body mass index (BMI) 32.0-32.9, adult: Secondary | ICD-10-CM

## 2021-01-17 LAB — POCT GLYCOSYLATED HEMOGLOBIN (HGB A1C): Hemoglobin A1C: 5.9 % — AB (ref 4.0–5.6)

## 2021-01-17 MED ORDER — METFORMIN HCL 1000 MG PO TABS
ORAL_TABLET | ORAL | 3 refills | Status: DC
Start: 2021-01-17 — End: 2021-09-05

## 2021-01-17 MED ORDER — TRESIBA FLEXTOUCH 100 UNIT/ML ~~LOC~~ SOPN
PEN_INJECTOR | SUBCUTANEOUS | 4 refills | Status: DC
Start: 2021-01-17 — End: 2022-01-25

## 2021-01-17 NOTE — Progress Notes (Signed)
Patient ID: Shelly Morgan, female   DOB: 08-Oct-1960, 60 y.o.   MRN: [id number]   This visit occurred during the SARS-CoV-2 public health emergency.  Safety protocols were in place, including screening questions prior to the visit, additional usage of staff PPE, and extensive cleaning of exam room while observing appropriate contact time as indicated for disinfecting solutions.   HPI: Shelly LeaderChristine Morgan is a 60 y.o.-year-old female, initially referred by her PCP, Manon HildingJessica Mauney, PA, for management of DM2, dx in 2008, GDM initially in 2002, insulin-dependent, uncontrolled, without long term complications. Last visit 4 months ago.  Interim history: She continues with intermittent fasting and a more plant-based diet.  She also reduce portions and started to go to the gym almost every day doing yoga, Zumba, treadmill, starting before last visit. No increased urination, blurry vision, nausea, chest pain. She or her husband are [place] a [place] in [place].  She will move in December.  Reviewed HbA1c levels: 07/26/2020: HbA1c 6.5% Lab Results  Component Value Date   HGBA1C 6.5 (A) 04/26/2020   HGBA1C 7.1 (A) 12/20/2019   HGBA1C 6.8 (A) 08/20/2019   HGBA1C 6.5 (A) 04/21/2019   HGBA1C 7.0 (A) 12/15/2018   HGBA1C 8.4 (A) 08/25/2018   HGBA1C 7.4 (A) 12/02/2017   HGBA1C 6.8 09/01/2017   HGBA1C 6.7 05/28/2017   HGBA1C 6.6 02/06/2017   HGBA1C 6.6 11/01/2016   HGBA1C 6.4 06/25/2016   HGBA1C 7.2 02/02/2016   HGBA1C 8.3 10/23/2015  01/25/2016: 7.4% 04/24/2015: HbA1c 7.9% 04/21/2014: HbA1c 6.9%  She is on: - Metformin 2000 mg with dinner - Glipizide 5 mg 2X a day before meals >> 5 mg before dinner - Trulicity 1.5 >> 3 mg weekly - Tresiba -restarted 08/2018 (she was off insulin due to weight gain in the past) >> 14 >> 16 >> 10 units daily (dose decreased approximately 1 month ago) She was on Jardiance 25 mg before b'fast. - started 03/2016 - had 1 yeast inf., but continued to have labial  itching >> on a steroid cream + Azo cream 3x a day >> but this did not help so she stopped Jardiance. She tried Trulicity before (2017) and developed nausea and GERD.  No side effects now. Victoza was not covered.  Pt checks her sugars 1-2 times a day per review of her log: - am: 107-147, 170 >> 113-160 >> 90-147, 150, 180 >> 76-117, 147 - 2h after b'fast: 136-144 >> 79, 153 >> 109-135 >> 107-108, 160 - before lunch: 80, 91-98 >> 74-113 >> 100-103 >> 91-133 - 2h after lunch: 127-134 >> 127-186 >> 68-126, 141 >> 71-127 - before dinner:  107-155 >> 86, 176 >> 97-114 >> 97-158 - 2h after dinner: 134-142 >> 79, 122, 162 >> 97-139 >> 69-162 - bedtime:  132-174 >> 100-107 >> 103-171 >> 93-151 - nighttime:   135 >> n/c >> 118 >> 96 > n/c Lowest sugar was 92 >> 87 >> 80 >> 74 >> 68 x1 >> 69; she has hypoglycemia awareness in the 90s. Highest sugar was  287 >> 182 >> 170 >> 250 (banana) >> 180 >> 160.  Glucometer: One Touch Verio  Pt's meals are: - Breakfast: Enterex or Eggs  + 1/2 Bagel >> smoothie >> protein drink (Slim fast) - snack: almonds - Lunch: 2 hard boiled eggs + veggies + fruit cup >> veggies + fruit cups + nuts + yoghurt >> crackers + cheese - Dinner: chicken or pork + veggies or salad  >> chicken + salad or veggies; Cong[place]inese  food - Snacks: 2: cottage cheese, fruit, veggies, nuts >> occas. Almonds, veggies She has seen nutrition in the past.  Mild CKD, last BUN/creatinine:  07/26/2020: 16/0.88,GFR 65, Glu 77 Lab Results  Component Value Date   BUN 12 04/21/2019   BUN 19 06/25/2016   CREATININE 1.02 04/21/2019   CREATININE 1.03 06/25/2016  07/27/2018: 18/1.04, GFR 61 05/15/2017: CMP normal, except glucose 171.  BUN/creatinine 20/1.01, GFR 56. Not on ACE inhibitor/ARB.  + HL; latest Lipids: 07/26/2020: 132/129/53/56 Lab Results  Component Value Date   CHOL 144 04/21/2019   HDL 45.50 04/21/2019   LDLCALC 72 04/21/2019   TRIG 130.0 04/21/2019   CHOLHDL 3 04/21/2019   07/27/2018: 215/244/45/121 05/15/2017: 189/202/47/102  She was previously on Lipitor.  We started Crestor 5 mg daily in 2020 >> no side effects  - last eye exam was 03/2020: No DR reportedly  - no numbness and tingling in her feet. On ASA 81.  Daughter studies dancing in [place] - started the Fall of 2020.  ROS: Constitutional: no weight gain/+ weight loss, no fatigue, no subjective hyperthermia, no subjective hypothermia Eyes: no blurry vision, no xerophthalmia ENT: no sore throat, no nodules palpated in neck, no dysphagia, no odynophagia, no hoarseness Cardiovascular: no CP/no SOB/no palpitations/no leg swelling Respiratory: no cough/no SOB/no wheezing Gastrointestinal: no N/no V/no D/no C/no acid reflux Musculoskeletal: no muscle aches/no joint aches Skin: no rashes, no hair loss Neurological: no tremors/no numbness/no tingling/no dizziness  I reviewed pt's medications, allergies, PMH, social hx, family hx, and changes were documented in the history of present illness. Otherwise, unchanged from my initial visit note.  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    Social History Main Topics   Smoking status: Former Smoker    Types: Cigarettes    Quit date: 1997   Smokeless tobacco: Not on file   Alcohol use Yes     Comment: 1-2 year   Drug use: no   Current Outpatient Medications on File Prior to Visit  Medication Sig Dispense Refill   BD PEN NEEDLE NANO 2ND GEN 32G X 4 MM MISC USE AS DIRECTED 200 each 2   diphenhydrAMINE (BENADRYL) 25 mg capsule Take 25 mg by mouth 2 (two) times daily.     glipiZIDE (GLUCOTROL) 5 MG tablet TAKE 1 TABLET BY MOUTH BEFORE dinner 180 tablet 3    insulin degludec (TRESIBA FLEXTOUCH) 100 UNIT/ML FlexTouch Pen INJECT 10 UNITS EVERY NIGHT (Patient taking differently: 16 Units. INJECT 16 UNITS EVERY NIGHT) 15 mL 4   Lancets (ONETOUCH DELICA PLUS LANCET33G) MISC USE AS DIRECTED TO TEST TWICE DAILY 200 each 3   metFORMIN (GLUCOPHAGE) 1000 MG tablet TAKE 2 TABLETS DAILY WITH DINNER. 180 tablet 2   ONETOUCH VERIO test strip USE AS DIRECTED TO TEST TWICE DAILY 200 strip 3   rosuvastatin (CRESTOR) 5 MG tablet TAKE 1 TABLET BY MOUTH EVERY DAY 90 tablet 1   TRULICITY 3 MG/0.5ML SOPN INJECT 3 MG INTO THE SKIN ONCE A WEEK. 6 mL 3   No current facility-administered medications on file prior to visit.   Allergies  Allergen Reactions   Codeine Nausea And Vomiting   Morphine And  Related Nausea And Vomiting   Family History  Problem Relation Age of Onset   COPD Mother    Diverticulitis Father    Crohn's disease Brother    PGF and F with DM.  PE: BP 122/70 (BP Location: Right Arm, Patient Position: Sitting, Cuff Size: Normal)   Pulse 74   Ht 5\' 8"  (1.727 m)   Wt 181 lb 9.6 oz (82.4 kg)   SpO2 97%   BMI 27.61 kg/m  Wt Readings from Last 3 Encounters:  01/17/21 181 lb 9.6 oz (82.4 kg)  09/01/20 191 lb 9.6 oz (86.9 kg)  04/26/20 204 lb 3.2 oz (92.6 kg)   Constitutional: overweight, in NAD Eyes: PERRLA, EOMI, no exophthalmos ENT: moist mucous membranes, no thyromegaly, no cervical lymphadenopathy Cardiovascular: RRR, No MRG Respiratory: CTA B Gastrointestinal: abdomen soft, NT, ND, BS+ Musculoskeletal: no deformities, strength intact in all 4 Skin: moist, warm, no rashes Neurological: no tremor with outstretched hands, DTR normal in all 4  ASSESSMENT: 1. DM2, insulin-dependent, uncontrolled, without long term complications, but with hyperglycemia  In 2019, we ruled out type 1 diabetes: Component     Latest Ref Rng & Units 09/01/2017  Hemoglobin A1C      6.8  C-Peptide     0.80 - 3.85 ng/mL 2.87  Islet Cell Ab     Neg:<1:1  Negative  Glutamic Acid Decarb Ab     <5 IU/mL <5  Glucose, Plasma     65 - 99 mg/dL 11/01/2017 (H)  ZNT8 Antibodies     U/mL <15   2. Obesity class 1 BMI Classification: < 18.5 underweight  18.5-24.9 normal weight  25.0-29.9 overweight  30.0-34.9 class I obesity  35.0-39.9 class II obesity  ? 40.0 class III obesity   3. HL  PLAN:  1. Patient with longstanding, uncontrolled, type 2 diabetes, on oral antidiabetic regimen with metformin and sulfonylurea along with long-acting insulin and weekly GLP-1 receptor agonist, with improved control in the last 2 years and an HbA1c of 6.5% at the last 2 visits.  Of note, she could not tolerate SGLT2 inhibitors due to vaginal itching despite using medicated creams.  She tolerates the GLP-1 receptor agonist well. -At last visit, sugars are mostly at goal but in the morning they were between 130 and 140.  This has been a pattern that she had for a long time.  We continued the same Tresiba dose, but since her sugars were at goal later in the day, we eliminated her a.m. glipizide and kept only the dose before dinner. -At today's visit, sugars are much improved from before, now at goal in the morning.  In the last month, after decreasing the dose of Tresiba, they increase slightly, but they are still mostly at goal.  For now, I advised her to continue with the same dose.  I did advise her that if the sugars continue to improve, to decrease [birth year]1976 dose further.  I am hoping that we can stop completely at next visit.  However, we can stop glipizide completely, especially since she did have few slightly lower blood sugars since last visit. - I suggested to:  Patient Instructions  Please continue: - Metformin 2000 mg with dinner - Trulicity 3 mg weekly - Tresiba 10 units daily  Stop daily Glipizide.  Please return in 6 months with your sugar log  - we checked her HbA1c: 5.9% (lower) - advised to check sugars at different times of the day - 1x a day, rotating  check times -  advised for yearly eye exams >> she is UTD - return to clinic in 4 months   2. Obesity class 1 -We will continue her Trulicity which should also help with weight loss -Before last visit, she lost 15 pounds through eating smaller portions and increasing exercise -she lost 10 lbs more since last OV!  3. HL -Reviewed latest lipid panel from 07/26/2020: All fractions at goal -Continues Crestor 5 mg daily without side effects   Carlus Pavlov, MD PhD [facility]

## 2021-01-17 NOTE — Patient Instructions (Addendum)
Please continue: - Metformin 2000 mg with dinner - Trulicity 3 mg weekly - Tresiba 10 units daily  Stop daily Glipizide.  Please return in 6 months with your sugar log

## 2021-01-24 ENCOUNTER — Other Ambulatory Visit: Payer: Self-pay | Admitting: Internal Medicine

## 2021-06-11 LAB — HM DIABETES EYE EXAM

## 2021-07-05 ENCOUNTER — Other Ambulatory Visit: Payer: Self-pay | Admitting: Internal Medicine

## 2021-07-25 ENCOUNTER — Encounter: Payer: Self-pay | Admitting: Internal Medicine

## 2021-07-25 ENCOUNTER — Other Ambulatory Visit: Payer: Self-pay

## 2021-07-25 ENCOUNTER — Ambulatory Visit: Payer: BC Managed Care – PPO | Admitting: Internal Medicine

## 2021-07-25 ENCOUNTER — Telehealth: Payer: Self-pay | Admitting: Internal Medicine

## 2021-07-25 VITALS — BP 120/78 | HR 78 | Ht 68.0 in | Wt 177.8 lb

## 2021-07-25 DIAGNOSIS — E1165 Type 2 diabetes mellitus with hyperglycemia: Secondary | ICD-10-CM

## 2021-07-25 DIAGNOSIS — Z6832 Body mass index (BMI) 32.0-32.9, adult: Secondary | ICD-10-CM

## 2021-07-25 DIAGNOSIS — E669 Obesity, unspecified: Secondary | ICD-10-CM

## 2021-07-25 DIAGNOSIS — E785 Hyperlipidemia, unspecified: Secondary | ICD-10-CM

## 2021-07-25 LAB — POCT GLYCOSYLATED HEMOGLOBIN (HGB A1C): Hemoglobin A1C: 7.4 % — AB (ref 4.0–5.6)

## 2021-07-25 MED ORDER — TRULICITY 3 MG/0.5ML ~~LOC~~ SOAJ
3.0000 mg | SUBCUTANEOUS | 3 refills | Status: DC
  Filled 2021-07-25: qty 2, 28d supply, fill #0

## 2021-07-25 NOTE — Progress Notes (Signed)
Patient ID: Shelly Morgan, female   DOB: 20-Jun-1960, 61 y.o.   MRN: [id number]   This visit occurred during the SARS-CoV-2 public health emergency.  Safety protocols were in place, including screening questions prior to the visit, additional usage of staff PPE, and extensive cleaning of exam room while observing appropriate contact time as indicated for disinfecting solutions.   HPI: Shelly Morgan is a 61 y.o.-year-old female, initially referred by her PCP, Suella Grove, PA, for management of DM2, dx in 2008, GDM initially in 2002, insulin-dependent, uncontrolled, without long term complications. Last visit 6 months ago.  Interim history: She continues with intermittent fasting and a more plant-based diet.  She also reduce portions and continues to go to the gym almost every day doing yoga, Zumba, treadmill. No increased urination, blurry vision, nausea, chest pain. 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  Reviewed HbA1c levels: Lab Results  Component Value Date   HGBA1C 5.9 (A) 01/17/2021   HGBA1C 6.5 (A) 04/26/2020   HGBA1C 7.1 (A) 12/20/2019   HGBA1C 6.8 (A) 08/20/2019   HGBA1C 6.5 (A) 04/21/2019   HGBA1C 7.0 (A) 12/15/2018   HGBA1C 8.4 (A) 08/25/2018   HGBA1C 7.4 (A) 12/02/2017   HGBA1C 6.8 09/01/2017   HGBA1C 6.7 05/28/2017   HGBA1C 6.6 02/06/2017   HGBA1C 6.6 11/01/2016   HGBA1C 6.4 06/25/2016   HGBA1C 7.2 02/02/2016   HGBA1C 8.3 10/23/2015  07/26/2020: HbA1c 6.5% 01/25/2016: 7.4% 04/24/2015: HbA1c 7.9% 04/21/2014: HbA1c 6.9%  She is on: - Metformin 2000 mg with dinner - >> stopped 01/2021 -  >> ran out 2 mo ago - Tresiba -restarted 08/2018 (she was off insulin due to weight gain in the past) >> 14 >> 16 >> 10 units daily  She was on Jardiance 25 mg before b'fast. - started 03/2016 - had 1 yeast inf., but continued to have labial itching >> on a steroid cream + Azo cream 3x a day >> but this did not help so she stopped Jardiance. She tried  Trulicity before (0000000) and developed nausea and GERD.  No side effects now. Victoza was not covered.  Pt checks her sugars 1-2 times a day per review of her log: - am: 190-147, 150, 180 >> 76-117, 147 >> 98-131, 141 - 2h after b'fast: 109-135 >> 107-108, 160 >> 117-162 - before lunch: 74-113 >> 100-103 >> 91-133 >> 88-108 - 2h after lunch: 68-126, 141 >> 71-127 >> 121-141 - before dinner: 86, 176 >> 97-114 >> 97-158 >> 96-111 - 2h after dinner: 97-139 >> 69-162 >> 130-157, 184 - bedtime:  100-107 >> 103-171 >> 93-151 >> n/c - nighttime:   135 >> n/c >> 118 >> 96 > n/c Lowest sugar was 69 >> 88; she has hypoglycemia awareness in the 90s. Highest sugar was 250 (banana) >> 180 >> 160 >> 184.  Glucometer: One Touch Verio  Pt's meals are: - Breakfast: Enterex or Eggs  + 1/2 Bagel >> smoothie >> protein drink (Slim fast) - snack: almonds - Lunch: 2 hard boiled eggs + veggies + fruit cup >> veggies + fruit cups + nuts + yoghurt >> crackers + cheese - Dinner: chicken or pork + veggies or salad  >> chicken + salad or veggies; chinese food - Snacks: 2: cottage cheese, fruit, veggies, nuts >> occas. Almonds, veggies She has seen nutrition in the past.  Mild CKD, last BUN/creatinine:  07/26/2020: 16/0.88,GFR 65, Glu 77 Lab Results  Component Value Date   BUN 12  04/21/2019   BUN 19 06/25/2016   CREATININE 1.02 04/21/2019   CREATININE 1.03 06/25/2016  07/27/2018: 18/1.04, GFR 61 05/15/2017: CMP normal, except glucose 171.  BUN/creatinine 20/1.01, GFR 56. Not on ACE inhibitor/ARB.  + HL; latest Lipids: 07/26/2020: 132/129/53/56 Lab Results  Component Value Date   CHOL 144 04/21/2019   HDL 45.50 04/21/2019   LDLCALC 72 04/21/2019   TRIG 130.0 04/21/2019   CHOLHDL 3 04/21/2019  07/27/2018: 215/244/45/121 05/15/2017: 189/202/47/102  She was previously on Lipitor.  We started Crestor 5 mg daily in 2020 >> no side effects  - last eye exam was 06/2021: No DR  - no numbness and tingling  in her feet. On ASA 81.  Daughter studies dancing in [place] - started the Fall of 2020.  ROS: + see HPI  I reviewed pt's medications, allergies, PMH, social hx, family hx, and changes were documented in the history of present illness. Otherwise, unchanged from my initial visit note.  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    Social History Main Topics   Smoking status: Former Smoker    Types: Cigarettes    Quit date: 1997   Smokeless tobacco: Not on file   Alcohol use Yes     Comment: 1-2 year   Drug use: no   Current Outpatient Medications on File Prior to Visit  Medication Sig Dispense Refill   BD PEN NEEDLE NANO 2ND GEN 32G X 4 MM MISC USE AS DIRECTED 200 each 2   diphenhydrAMINE (BENADRYL) 25 mg capsule Take 25 mg by mouth 2 (two) times daily.     insulin degludec (TRESIBA FLEXTOUCH) 100 UNIT/ML FlexTouch Pen INJECT 10 UNITS EVERY NIGHT 15 mL 4   Lancets (ONETOUCH DELICA PLUS 123XX123) MISC USE AS DIRECTED TO TEST TWICE DAILY 200 each 3   metFORMIN (GLUCOPHAGE) 1000 MG tablet Take 4 tablets by mouth at night 180 tablet 3   ONETOUCH VERIO test strip USE AS DIRECTED TO TEST TWICE DAILY 200 strip 3   rosuvastatin (CRESTOR) 5 MG tablet TAKE 1 TABLET BY MOUTH EVERY DAY 90 tablet 1   TRULICITY 3 0000000 SOPN INJECT 3 MG INTO THE SKIN ONCE A WEEK. 6 mL 3   No current facility-administered medications on file prior to visit.   Allergies  Allergen Reactions   Codeine Nausea And Vomiting   Morphine And Related Nausea And Vomiting   Family History  Problem Relation Age of Onset   COPD Mother    Diverticulitis Father    Crohn's disease Brother    PGF and F with  DM.  PE: BP 120/78 (BP Location: Right Arm, Patient Position: Sitting, Cuff Size: Normal)    Pulse 78    Ht 5\' 8"  (1.727 m)    Wt 177 lb 12.8 oz (80.6 kg)    SpO2 99%    BMI 27.03 kg/m  Wt Readings from Last 3 Encounters:  07/25/21 177 lb 12.8 oz (80.6 kg)  01/17/21 181 lb 9.6 oz (82.4 kg)  09/01/20 191 lb 9.6 oz (86.9 kg)   Constitutional: overweight, in NAD Eyes: PERRLA, EOMI, no exophthalmos ENT: moist mucous membranes, no thyromegaly, no cervical lymphadenopathy Cardiovascular: RRR,  No MRG Respiratory: CTA B Musculoskeletal: no deformities, strength intact in all 4 Skin: moist, warm, no rashes Neurological: no tremor with outstretched hands, DTR normal in all 4 Diabetic Foot Exam - Simple   Simple Foot Form Diabetic Foot exam was performed with the following findings: Yes 07/25/2021  9:14 AM  Visual Inspection No deformities, no ulcerations, no other skin breakdown bilaterally: Yes Sensation Testing Intact to touch and monofilament testing bilaterally: Yes Pulse Check Posterior Tibialis and Dorsalis pulse intact bilaterally: Yes Comments    ASSESSMENT: 1. DM2, insulin-dependent, uncontrolled, without long term complications, but with hyperglycemia  In 2019, we ruled out type 1 diabetes: Component     Latest Ref Rng & Units 09/01/2017  Hemoglobin A1C      6.8  C-Peptide     0.80 - 3.85 ng/mL 2.87  Islet Cell Ab     Neg:<1:1 Negative  Glutamic Acid Decarb Ab     <5 IU/mL <5  Glucose, Plasma     65 - 99 mg/dL 172 (H)  ZNT8 Antibodies     U/mL <15   2. Obesity class 1 BMI Classification: < 18.5 underweight  18.5-24.9 normal weight  25.0-29.9 overweight  30.0-34.9 class I obesity  35.0-39.9 class II obesity  ? 40.0 class III obesity   3. HL  PLAN:  1. Patient with longstanding, uncontrolled, type 2 diabetes, on oral antidiabetic regimen with metformin, along with long-acting insulin and weekly GLP-1 receptor agonist with improved control in the last 2.5 years  with an HbA1c of 5.9% at last visit.  At that time, sugars are much improved, all at goal.  I advised her to stop her glipizide.  We also discussed about decreasing the Tresiba dose further if her sugars continue to improve. -This visit, unfortunately, she was on last Trulicity for 2 months due to [employer].  She did not notice that her sugars are higher.  I also do not see much higher blood sugars in her log.  However, HbA1c is higher (see below).  We discussed about continuing with metformin and Tyler Aas and trying to restart Trulicity again.  I sent this to another pharmacy which I believe has it in stock. - I suggested to:  Patient Instructions  Please continue: - Metformin 2000 mg with dinner - Tresiba 10 units daily  Please restart: - Trulicity 3 mg weekly  Please return in 4-6 months with your sugar log   - we checked her HbA1c: 7.4% (higher) - but higher than expected from her excellent CBG log... - advised to check sugars at different times of the day - 1x a day, rotating check times - advised for yearly eye exams >> she is UTD - foot exam performed today - return to clinic in 6 months   2. Obesity class 1 -We will try to restart Trulicity which should also help with weight loss -She lost 25 pounds before the last 2 visits combined through eating a more plant-based diet, doing intermittent fasting, and eating smaller portions and increasing exercise -I reviewed her weight per our and [facility] records: In 07/2018 she weighed 224 lbs >> in 07/2021: 177 lbs!  3. HL -Reviewed latest lipid panel from 07/2020: All fractions at goal -We will continue Crestor 5 mg daily without side effects -She is due for another lipid panel -she has an annual physical exam with PCP next week -she will have labs performed at that time  Philemon Kingdom, MD PhD [facility]

## 2021-07-25 NOTE — Patient Instructions (Addendum)
Please continue: - Metformin 2000 mg with dinner - Tresiba 10 units daily  Please restart: - Trulicity 3 mg weekly  Please return in 4-6 months with your sugar log

## 2021-07-25 NOTE — Telephone Encounter (Signed)
Pt came back up from the pharmacy and stated that they do not have Rx Trulicity and would like for it to go to a different pharmacy by the name of [facility] but did not know the complete information will call back to give the complete information about the pharmacy.

## 2021-07-25 NOTE — Telephone Encounter (Signed)
Called and pt's new pharmacy only has the 1.5 dose of Trulicity available. Pt has been off medication for 2 months and wanted to know if she can do the 1.5 mg until the 3.0 mg is back in stock.

## 2021-07-26 MED ORDER — TRULICITY 1.5 MG/0.5ML ~~LOC~~ SOAJ
1.5000 mg | SUBCUTANEOUS | 1 refills | Status: DC
Start: 2021-07-26 — End: 2021-08-28

## 2021-07-26 NOTE — Telephone Encounter (Signed)
Rx sent to preferred pharmacy. Pt advised via My[facility].

## 2021-08-27 ENCOUNTER — Other Ambulatory Visit: Payer: Self-pay | Admitting: Internal Medicine

## 2021-08-27 DIAGNOSIS — E1165 Type 2 diabetes mellitus with hyperglycemia: Secondary | ICD-10-CM

## 2021-09-05 ENCOUNTER — Other Ambulatory Visit: Payer: Self-pay | Admitting: Internal Medicine

## 2021-10-30 ENCOUNTER — Other Ambulatory Visit: Payer: Self-pay | Admitting: Internal Medicine

## 2021-10-30 DIAGNOSIS — E1165 Type 2 diabetes mellitus with hyperglycemia: Secondary | ICD-10-CM

## 2021-12-27 ENCOUNTER — Other Ambulatory Visit: Payer: Self-pay | Admitting: Internal Medicine

## 2021-12-27 DIAGNOSIS — E1165 Type 2 diabetes mellitus with hyperglycemia: Secondary | ICD-10-CM

## 2022-01-24 ENCOUNTER — Other Ambulatory Visit: Payer: Self-pay | Admitting: Internal Medicine

## 2022-01-25 ENCOUNTER — Other Ambulatory Visit: Payer: Self-pay | Admitting: Internal Medicine

## 2022-01-30 ENCOUNTER — Ambulatory Visit: Payer: BC Managed Care – PPO | Admitting: Internal Medicine

## 2022-03-26 ENCOUNTER — Other Ambulatory Visit: Payer: Self-pay | Admitting: Internal Medicine

## 2022-03-26 DIAGNOSIS — E1165 Type 2 diabetes mellitus with hyperglycemia: Secondary | ICD-10-CM

## 2022-04-03 ENCOUNTER — Ambulatory Visit: Payer: BC Managed Care – PPO | Admitting: Internal Medicine

## 2022-04-03 ENCOUNTER — Encounter: Payer: Self-pay | Admitting: Internal Medicine

## 2022-04-03 VITALS — BP 130/80 | HR 70 | Ht 68.0 in | Wt 195.4 lb

## 2022-04-03 DIAGNOSIS — E1165 Type 2 diabetes mellitus with hyperglycemia: Secondary | ICD-10-CM | POA: Diagnosis not present

## 2022-04-03 DIAGNOSIS — E785 Hyperlipidemia, unspecified: Secondary | ICD-10-CM | POA: Diagnosis not present

## 2022-04-03 DIAGNOSIS — E663 Overweight: Secondary | ICD-10-CM

## 2022-04-03 LAB — POCT GLYCOSYLATED HEMOGLOBIN (HGB A1C): Hemoglobin A1C: 6.5 % — AB (ref 4.0–5.6)

## 2022-04-03 MED ORDER — TRULICITY 1.5 MG/0.5ML ~~LOC~~ SOAJ: SUBCUTANEOUS | 11 refills | Status: DC

## 2022-04-03 NOTE — Patient Instructions (Addendum)
Please continue: - Metformin 2000 mg with dinner - Tresiba 10 units daily - Trulicity 1.5 mg weekly  Please return in 4-6 months with your sugar log

## 2022-04-03 NOTE — Progress Notes (Signed)
Patient ID: Shelly Morgan, female   DOB: 11-Jan-1961, 61 y.o.   MRN: [id number]   HPI: Shelly Morgan is a 61 y.o.-year-old female, initially referred by her PCP, Shelly Grove, PA, for management of DM2, dx in 2008, GDM initially in 2002, insulin-dependent, uncontrolled, without long term complications. Last visit 6 months ago.  Interim history: She continues with intermittent fasting and a more plant-based diet.  She also reduced portions and continues to go to the gym almost every day doing yoga, Zumba, treadmill. She now also does Body Pump >> started to gain muscle. No increased urination, blurry vision, nausea, chest pain. 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  She is now on the lower dose, 1.5 mg weekly.  Reviewed HbA1c levels: Lab Results  Component Value Date   HGBA1C 7.4 (A) 07/25/2021   HGBA1C 5.9 (A) 01/17/2021   HGBA1C 6.5 (A) 04/26/2020   HGBA1C 7.1 (A) 12/20/2019   HGBA1C 6.8 (A) 08/20/2019   HGBA1C 6.5 (A) 04/21/2019   HGBA1C 7.0 (A) 12/15/2018   HGBA1C 8.4 (A) 08/25/2018   HGBA1C 7.4 (A) 12/02/2017   HGBA1C 6.8 09/01/2017   HGBA1C 6.7 05/28/2017   HGBA1C 6.6 02/06/2017   HGBA1C 6.6 11/01/2016   HGBA1C 6.4 06/25/2016   HGBA1C 7.2 02/02/2016   HGBA1C 8.3 10/23/2015  07/26/2020: HbA1c 6.5% 01/25/2016: 7.4% 04/24/2015: HbA1c 7.9% 04/21/2014: HbA1c 6.9%  She is on: - Metformin 2000 mg with dinner - Trulicity 3 >> 1.5 mg weekly - Tresiba -restarted 08/2018 (she was off insulin due to weight gain in the past) >> 14 >> 16 >> 10 units daily  She was on Jardiance 25 mg before b'fast. - started 03/2016 - had 1 yeast inf., but continued to have labial itching >> on a steroid cream + Azo cream 3x a day >> but this did not help so she stopped Jardiance. She tried Trulicity before (1751) and developed nausea and GERD.  No side effects now. Victoza was not covered. She was previously on glipizide, stopped in 01/2021.  Pt checks her  sugars 1-2 times a day per review of her excellent log: - am: 90-147, 150, 180 >> 76-117, 147 >> 98-131, 141 >> 86-137 - 2h after b'fast: 109-135 >> 107-108, 160 >> 117-162 >> 85-132 - before lunch: 74-113 >> 100-103 >> 91-133 >> 88-108 >> 79-123 - 2h after lunch: 68-126, 141 >> 71-127 >> 121-141 >> 121-137 - before dinner: 86, 176 >> 97-114 >> 97-158 >> 96-111 >> 82-112 - 2h after dinner: 97-139 >> 69-162 >> 130-157, 184 >> 129-156 - bedtime:  100-107 >> 103-171 >> 93-151 >> n/c >> 117-147, 174 - nighttime:   135 >> n/c >> 118 >> 96 > n/c Lowest sugar was 69 >> 88 >> 79; she has hypoglycemia awareness in the 90s. Highest sugar was 250 (banana) >> 180 >> 160 >> 184 >> 174.  Glucometer: One Touch Verio  Pt's meals are: - Breakfast: Enterex or Eggs  + 1/2 Bagel >> smoothie >> protein drink (Slim fast) - snack: almonds - Lunch: 2 hard boiled eggs + veggies + fruit cup >> veggies + fruit cups + nuts + yoghurt >> crackers + cheese - Dinner: chicken or pork + veggies or salad  >> chicken + salad or veggies; chinese food - Snacks: 2: cottage cheese, fruit, veggies, nuts >> occas. Almonds, veggies She has seen nutrition in the past.  Mild CKD, last BUN/creatinine:  07/26/2020: 16/0.88,GFR 65, Glu 77 Lab Results  Component Value Date  BUN 12 04/21/2019   BUN 19 06/25/2016   CREATININE 1.02 04/21/2019   CREATININE 1.03 06/25/2016  07/27/2018: 18/1.04, GFR 61 05/15/2017: CMP normal, except glucose 171.  BUN/creatinine 20/1.01, GFR 56. Not on ACE inhibitor/ARB.  + HL; latest Lipids: 07/26/2020: 132/129/53/56 Lab Results  Component Value Date   CHOL 144 04/21/2019   HDL 45.50 04/21/2019   LDLCALC 72 04/21/2019   TRIG 130.0 04/21/2019   CHOLHDL 3 04/21/2019  07/27/2018: 215/244/45/121 05/15/2017: 189/202/47/102  She was previously on Lipitor.  We started Crestor 5 mg daily in 2020 >> no side effects  - last eye exam was 06/2021: No DR  - no numbness and tingling in her feet.  Last  foot exam 07/2021. On ASA 81.  Daughter studies dancing in [place] - started the Fall of 2020.  ROS: + see HPI  I reviewed pt's medications, allergies, PMH, social hx, family hx, and changes were documented in the history of present illness. Otherwise, unchanged from my initial visit note.  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    Social History Main Topics   Smoking status: Former Smoker    Types: Cigarettes    Quit date: 1997   Smokeless tobacco: Not on file   Alcohol use Yes     Comment: 1-2 year   Drug use: no   Current Outpatient Medications on File Prior to Visit  Medication Sig Dispense Refill   BD PEN NEEDLE NANO 2ND GEN 32G X 4 MM MISC USE AS DIRECTED 200 each 2   diphenhydrAMINE (BENADRYL) 25 mg capsule Take 25 mg by mouth 2 (two) times daily.     Lancets (ONETOUCH DELICA PLUS LANCET33G) MISC USE AS DIRECTED TO TEST TWICE DAILY 200 each 3   metFORMIN (GLUCOPHAGE) 1000 MG tablet TAKE 4 TABLETS BY MOUTH EVERY night 180 tablet 0   ONETOUCH VERIO test strip USE AS DIRECTED TO TEST TWICE DAILY 200 strip 3   TRESIBA FLEXTOUCH 100 UNIT/ML FlexTouch Pen Inject 10 units every night 15 mL PRN   TRULICITY 1.5 MG/0.5ML SOPN inject 1.5 MG into THE SKIN ONCE WEEKLY 2 mL 0   No current facility-administered medications on file prior to visit.   Allergies  Allergen Reactions   Codeine Nausea And Vomiting   Morphine And Related Nausea And Vomiting   Family History  Problem Relation Age of Onset   COPD Mother    Diverticulitis Father    Crohn's disease Brother    PGF and F with DM.  PE: BP 130/80 (BP Location: Left Arm, Patient Position: Sitting, Cuff Size: Normal)   Pulse  70   Ht 5\' 8"  (1.727 m)   Wt 195 lb 6.4 oz (88.6 kg)   SpO2 99%   BMI 29.71 kg/m  Wt Readings from Last 3 Encounters:  04/03/22 195 lb 6.4 oz (88.6 kg)  07/25/21 177 lb 12.8 oz (80.6 kg)  01/17/21 181 lb 9.6 oz (82.4 kg)   Constitutional: overweight, in NAD Eyes:  EOMI, no exophthalmos ENT: no neck masses, no cervical lymphadenopathy Cardiovascular: RRR, No MRG Respiratory: CTA B Musculoskeletal: no deformities Skin:no rashes Neurological: no tremor with outstretched hands Diabetic Foot Exam -  Simple   Simple Foot Form Diabetic Foot exam was performed with the following findings: Yes 04/03/2022  8:42 AM  Visual Inspection No deformities, no ulcerations, no other skin breakdown bilaterally: Yes Sensation Testing Intact to touch and monofilament testing bilaterally: Yes Pulse Check Posterior Tibialis and Dorsalis pulse intact bilaterally: Yes Comments    ASSESSMENT: 1. DM2, insulin-dependent, uncontrolled, without long term complications, but with hyperglycemia  In 2019, we ruled out type 1 diabetes: Component     Latest Ref Rng & Units 09/01/2017  Hemoglobin A1C      6.8  C-Peptide     0.80 - 3.85 ng/mL 2.87  Islet Cell Ab     Neg:<1:1 Negative  Glutamic Acid Decarb Ab     <5 IU/mL <5  Glucose, Plasma     65 - 99 mg/dL 553 (H)  ZNT8 Antibodies     U/mL <15   2. Overweight BMI Classification: < 18.5 underweight  18.5-24.9 normal weight  25.0-29.9 overweight  30.0-34.9 class I obesity  35.0-39.9 class II obesity  ? 40.0 class III obesity   3. HL  PLAN:  1. Patient with longstanding, uncontrolled, type 2 diabetes, on oral antidiabetic regimen with metformin and also long-acting insulin and weekly GLP-1 receptor agonist, with improved control in the last 3 years, but an HbA1c at last visit higher than previous: 7.4%.  At that time, she was off Trulicity due to [employer] and noticed that sugars are higher.  We continued metformin and [place] and I sent a  prescription to Trulicity to another pharmacy.  She was able to obtain this.  However, she had to decrease the dose to 1.5 mg weekly again due to shortage. - today, sugars are at goal with few very mild hyperglycemic exceptions in the morning.  She is doing well even on the lower dose of Trulicity, we can continue this. - I suggested to:  Patient Instructions  Please continue: - Metformin 2000 mg with dinner - Tresiba 10 units daily - Trulicity 1.5 mg weekly  Please return in 4-6 months with your sugar log  - we checked her HbA1c: 6.5% (lower) - advised to check sugars at different times of the day - 1x a day, rotating check times - advised for yearly eye exams >> she is UTD - return to clinic in 4-6 months   2. Overweight -She continues on Trulicity which should also help with weight loss -She lost a significant amount of weight by eating a more plant-based diet, doing intermittent fasting, and eating smaller portions and increasing exercise -I reviewed her weight for our and [facility] records: In 07/2018 she weighed 224 lbs >> in 07/2021: 177 lbs! -At today's visit, she gained 18 lbs -part of it is muscle, though, as she is doing more weight training  3. HL - Reviewed latest lipid panel from 2022: All fractions at goal -she had labs by PCP recently-she will send these to me -We will continue Crestor 5 mg daily.  No side effects from this.  Carlus Pavlov, MD PhD [facility]

## 2022-05-31 ENCOUNTER — Other Ambulatory Visit: Payer: Self-pay | Admitting: Internal Medicine

## 2022-08-20 ENCOUNTER — Other Ambulatory Visit: Payer: Self-pay | Admitting: Internal Medicine

## 2022-09-27 ENCOUNTER — Other Ambulatory Visit: Payer: Self-pay | Admitting: Internal Medicine

## 2022-10-02 ENCOUNTER — Ambulatory Visit: Payer: BC Managed Care – PPO | Admitting: Internal Medicine

## 2022-10-02 ENCOUNTER — Encounter: Payer: Self-pay | Admitting: Internal Medicine

## 2022-10-02 VITALS — BP 120/72 | HR 66 | Ht 68.0 in | Wt 191.4 lb

## 2022-10-02 DIAGNOSIS — E785 Hyperlipidemia, unspecified: Secondary | ICD-10-CM

## 2022-10-02 DIAGNOSIS — E1165 Type 2 diabetes mellitus with hyperglycemia: Secondary | ICD-10-CM

## 2022-10-02 DIAGNOSIS — E663 Overweight: Secondary | ICD-10-CM

## 2022-10-02 LAB — POCT GLYCOSYLATED HEMOGLOBIN (HGB A1C): Hemoglobin A1C: 7.7 % — AB (ref 4.0–5.6)

## 2022-10-02 MED ORDER — ONETOUCH VERIO FLEX SYSTEM W/DEVICE KIT: PACK | 0 refills | Status: DC

## 2022-10-02 NOTE — Progress Notes (Signed)
Patient ID: Shelly Morgan, female   DOB: 11/25/1960, 62 y.o.   MRN: [id number]   HPI: Shelly Morgan is a 62 y.o.-year-old female, initially referred by her PCP, Manon Hilding, PA, for management of DM2, dx in 2008, GDM initially in 2002, insulin-dependent, uncontrolled, without long term complications. Last visit 6 months ago.  Interim history: Before last visit, she reduced portions and going to the gym. She is doing mostly cardio now - has hip pain.  She had a steroid injection in the hip 6 weeks ago >> higher sugars. No increased urination, blurry vision, nausea, chest pain.  Reviewed HbA1c levels: 08/05/2022: HbA1c 6.5% Lab Results  Component Value Date   HGBA1C 6.5 (A) 04/03/2022   HGBA1C 7.4 (A) 07/25/2021   HGBA1C 5.9 (A) 01/17/2021   HGBA1C 6.5 (A) 04/26/2020   HGBA1C 7.1 (A) 12/20/2019   HGBA1C 6.8 (A) 08/20/2019   HGBA1C 6.5 (A) 04/21/2019   HGBA1C 7.0 (A) 12/15/2018   HGBA1C 8.4 (A) 08/25/2018   HGBA1C 7.4 (A) 12/02/2017   HGBA1C 6.8 09/01/2017   HGBA1C 6.7 05/28/2017   HGBA1C 6.6 02/06/2017   HGBA1C 6.6 11/01/2016   HGBA1C 6.4 06/25/2016   HGBA1C 7.2 02/02/2016   HGBA1C 8.3 10/23/2015  07/26/2020: HbA1c 6.5% 01/25/2016: 7.4% 04/24/2015: HbA1c 7.9% 04/21/2014: HbA1c 6.9%  She is on: - Metformin 2000 mg with dinner - Trulicity 3 >> 1.5 mg weekly - Tresiba 14 >> 16 >> 10 units daily  She was on Jardiance 25 mg before b'fast. - started 03/2016 - had 1 yeast inf., but continued to have labial itching >> on a steroid cream + Azo cream 3x a day >> but this did not help so she stopped Jardiance. She tried Trulicity before (2017) and developed nausea and GERD.  No side effects now. Victoza was not covered. She was previously on glipizide, stopped in 01/2021.  Pt checks her sugars 1-2 times a day per her recall:her excellent log: - am: 90-147, 150, 180 >> 76-117, 147 >> 98-131, 141 >> 86-137 >> 101-190, ave: 130 - 2h after b'fast: 109-135 >> 107-108, 160 >> 117-162  >> 85-132 >> 131-137 - before lunch: 74-113 >> 100-103 >> 91-133 >> 88-108 >> 79-123 >> 89-97 - 2h after lunch: 68-126, 141 >> 71-127 >> 121-141 >> 121-137 >> 130s - before dinner: 86, 176 >> 97-114 >> 97-158 >> 96-111 >> 82-112 >> 80s-100 - 2h after dinner: 97-139 >> 69-162 >> 130-157, 184 >> 129-156 >> 130s - bedtime:  100-107 >> 103-171 >> 93-151 >> n/c >> 117-147, 174 >> 130s - nighttime:   135 >> n/c >> 118 >> 96 > n/c Lowest sugar was 69 >> 88 >> 79 >> 76; she has hypoglycemia awareness in the 90s. Highest sugar was 250 (banana) >> 180 >> 160 >> 184 >> 174 >> 190.  Glucometer: One Touch Verio  Pt's meals are: - Breakfast: Enterex or Eggs  + 1/2 Bagel >> smoothie >> protein drink (Slim fast) - snack: almonds - Lunch: 2 hard boiled eggs + veggies + fruit cup >> veggies + fruit cups + nuts + yoghurt >> crackers + cheese - Dinner: chicken or pork + veggies or salad  >> chicken + salad or veggies; chinese food - Snacks: 2: cottage cheese, fruit, veggies, nuts >> occas. Almonds, veggies She has seen nutrition in the past.  Mild CKD, last BUN/creatinine:  Basic Metabolic Reviewed date:08/01/2022 03:54:15 PM Interpretation:Abnormal Performing Lab: Notes/Report: Testing Performed at: [facility], [address], [address], [place], [place]  [address]  Glucose 152 70-99 mg/dL    BUN 17 9-14 mg/dL    Creatinine 7.82 9.56-2.13 mg/dl    [id number] 61 >96 calc In accordance with recommendations from NKF-ASN Task Force, [facility] has updated its eGFR calc to the 2021 CKD-EDI equation that estimates kidney function without a race variable;Stage 1 > 90 ML/Min plus Albuminuria;Stage 2 60-89 ML/MIN;Stage 3 30-59 ML/MIN;Stage 4 15-29 ML/MIN;Stage 5 <15 ML/MIN  Sodium 138 136-145 mmol/L    Potassium 4.4 3.5-5.5 mmol/L    Chloride 104 98-107 mmol/L    CO2 28 22-32 mmol/L    Anion Gap 10.9 6.0-20.0 mmol/L    Calcium 9.8 8.6-10.3 mg/dL    Albumin 4.3 2.9-5.2 g/dL    CA-corrected 8.41 3.24-40.10 mg/dL          Microalbumin Panel Reviewed date:08/01/2022 09:15:03 PM Interpretation:nml Performing Lab: Notes/Report: Testing Performed at: [facility], [address], [address], [place], [place] [address]  MA/CR ratio <17.1 0.0-30.0 mg/G    UMA <0.7      UCR 41    11/05/2021: Glucose 92, GFR 67, BUN/creatinine 19/0.96 07/26/2020: 16/0.88,GFR 65, Glu 77 Lab Results  Component Value Date   BUN 12 04/21/2019   BUN 19 06/25/2016   CREATININE 1.02 04/21/2019   CREATININE 1.03 06/25/2016  07/27/2018: 18/1.04, GFR 61 05/15/2017: CMP normal, except glucose 171.  BUN/creatinine 20/1.01, GFR 56. Not on ACE inhibitor/ARB.  + HL; latest Lipids: Lipid Panel w/reflex Reviewed date:08/01/2022 03:41:18 PM Interpretation:At goal Performing Lab: Notes/Report: Testing Performed at: [facility], [address], [address], [place], [place] [address]  Cholesterol 134 <200 mg/dL    CHOL/HDL 2.5 3.4-7.4 Ratio    HDLD 53 30-85 mg/dL Values below 40 mg/dL indicate increased risk factor  Triglyceride 118 0-199 mg/dL    NHDL 81 2-595 mg/dL Range dependent upon risk factors.  LDL Chol Calc (NIH) 60 0-99 mg/dL    11/04/8754: 433/29/51/88 07/26/2020: 132/129/53/56 Lab Results  Component Value Date   CHOL 144 04/21/2019   HDL 45.50 04/21/2019   LDLCALC 72 04/21/2019   TRIG 130.0 04/21/2019   CHOLHDL 3 04/21/2019  07/27/2018: 215/244/45/121 05/15/2017: 189/202/47/102  She was previously on Lipitor.  We started Crestor 5 mg daily in 2020 >> no side effects  - last eye exam was 09/24/2022: No DR reportedly- Dr. Shea Evans.  - no numbness and tingling in her feet.  Last foot exam 04/03/2022. On ASA 81.  Daughter studies dancing in [place] - started the Fall of 2020.  ROS: + see HPI  I reviewed pt's medications, allergies, PMH, social hx, family hx, and changes were documented in the history of present illness. Otherwise, unchanged from my initial visit note.  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    Social History Main Topics   Smoking status: Former Smoker    Types: Cigarettes    Quit date: 1997   Smokeless tobacco: Not on file   Alcohol use Yes     Comment: 1-2 year   Drug use: no   Current Outpatient Medications on File Prior to Visit  Medication Sig Dispense Refill   diphenhydrAMINE (BENADRYL) 25 mg capsule Take 25 mg by mouth 2 (  two) times daily.     Dulaglutide (TRULICITY) 1.5 MG/0.5ML SOPN inject 1.5 MG into THE SKIN ONCE WEEKLY 2 mL 11   GLOBAL EASE INJECT PEN NEEDLES 32G X 4 MM MISC USE AS DIRECTED TO inject insulin 200 each PRN   Lancets (ONETOUCH DELICA PLUS LANCET33G) MISC USE AS DIRECTED TO TEST TWICE DAILY 200 each 3   metFORMIN (GLUCOPHAGE) 1000 MG tablet take 1 Tablet by mouth twice daily WITH meals 180 tablet 1   ONETOUCH VERIO test strip USE AS DIRECTED TO TEST TWICE DAILY 200 strip 3   TRESIBA FLEXTOUCH 100 UNIT/ML FlexTouch Pen Inject 10 units every night 15 mL PRN   No current facility-administered medications on file prior to visit.   Allergies  Allergen Reactions   Codeine Nausea And Vomiting   Morphine And Related Nausea And Vomiting   Family History  Problem Relation Age of Onset   COPD Mother    Diverticulitis Father    Crohn's disease Brother    PGF and F with DM.  PE: BP 120/72 (BP Location: Right Arm, Patient Position: Sitting, Cuff Size: Normal)   Pulse 66   Ht 5\' 8"  (1.727 m)   Wt 191 lb 6.4 oz (86.8 kg)   SpO2 99%   BMI 29.10 kg/m  Wt Readings from Last 3 Encounters:  10/02/22 191 lb 6.4 oz (86.8 kg)  04/03/22 195 lb 6.4 oz (88.6 kg)  07/25/21 177 lb 12.8 oz (80.6 kg)   Constitutional: overweight, in NAD Eyes:  EOMI, no  exophthalmos ENT: no neck masses, no cervical lymphadenopathy Cardiovascular: RRR, No MRG Respiratory: CTA B Musculoskeletal: no deformities Skin:no rashes Neurological: no tremor with outstretched hands  ASSESSMENT: 1. DM2, insulin-dependent, uncontrolled, without long term complications, but with hyperglycemia  In 2019, we ruled out type 1 diabetes: Component     Latest Ref Rng & Units 09/01/2017  Hemoglobin A1C      6.8  C-Peptide     0.80 - 3.85 ng/mL 2.87  Islet Cell Ab     Neg:<1:1 Negative  Glutamic Acid Decarb Ab     <5 IU/mL <5  Glucose, Plasma     65 - 99 mg/dL 161 (H)  ZNT8 Antibodies     U/mL <15   2. Overweight  3. HL  PLAN:  1. Patient with longstanding, uncontrolled, type 2 diabetes, on oral antidiabetic regimen with metformin but also injectable regimen with long-acting insulin and GLP-1 receptor agonist, with improved control at last visit, when HbA1c returned 6.5%.  At that time, sugars were at goal with few very mild hyperglycemic exceptions in the morning.  We did not change her regimen. -At today's visit, sugars are slightly higher than goal in the morning but they are at goal later in the day reportedly.  She did forget her log at home.  These blood sugars do not correlate with a higher HbA1c (see below)  But she does report higher blood sugars around the time of a steroid injection 6 months ago. -For now, we discussed about changing her glucometer, which she had for a long time, but otherwise I did not suggest a change in regimen.  If sugars persistently high in the morning we may need to increase the Tresiba dose but I did not advise her to do this now since sugars before meals, later in the day, are lower in the target range. - I suggested to:  Patient Instructions  Please continue: - Metformin 2000 mg with dinner - Tresiba 10 units  daily - Trulicity 1.5 mg weekly   Please return in 4 months with your sugar log  - we checked her HbA1c: 7.7%  (higher) - advised to check sugars at different times of the day - 1x a day, rotating check times - advised for yearly eye exams >> she is UTD - return to clinic in 4 months   2. Overweight -She can use Trulicity which should also help with weight loss -She lost a significant amount of weight by eating a more plant-based diet, doing intermittent fasting and eating smaller portions and increasing exercise -Per [place] records, she lost 47 pounds between 07/2018 (224 pounds) to 07/2021 (177 pounds) -Before last visit, she gained 18 pounds-part of it probably muscle as she was doing more weight training -She lost 4 pounds since then-mostly doing cardio now  3. HL -Reviewed latest lipid panel from 2024: All fractions at goal -We will continue Crestor 5 mg daily.  No side effects.  Carlus Pavlov, MD PhD [facility]

## 2022-10-02 NOTE — Patient Instructions (Addendum)
Please continue: - Metformin 2000 mg with dinner - Tresiba 10 units daily - Trulicity 1.5 mg weekly   Please return in 4 months with your sugar log

## 2023-02-07 ENCOUNTER — Encounter: Payer: Self-pay | Admitting: Internal Medicine

## 2023-02-07 ENCOUNTER — Ambulatory Visit: Payer: BC Managed Care – PPO | Admitting: Internal Medicine

## 2023-02-07 VITALS — BP 124/80 | HR 74 | Ht 68.0 in | Wt 195.8 lb

## 2023-02-07 DIAGNOSIS — E785 Hyperlipidemia, unspecified: Secondary | ICD-10-CM | POA: Diagnosis not present

## 2023-02-07 DIAGNOSIS — Z7984 Long term (current) use of oral hypoglycemic drugs: Secondary | ICD-10-CM | POA: Diagnosis not present

## 2023-02-07 DIAGNOSIS — Z794 Long term (current) use of insulin: Secondary | ICD-10-CM

## 2023-02-07 DIAGNOSIS — E663 Overweight: Secondary | ICD-10-CM

## 2023-02-07 DIAGNOSIS — Z7985 Long-term (current) use of injectable non-insulin antidiabetic drugs: Secondary | ICD-10-CM | POA: Diagnosis not present

## 2023-02-07 DIAGNOSIS — E1165 Type 2 diabetes mellitus with hyperglycemia: Secondary | ICD-10-CM | POA: Diagnosis not present

## 2023-02-07 LAB — POCT GLYCOSYLATED HEMOGLOBIN (HGB A1C): Hemoglobin A1C: 7.5 % — AB (ref 4.0–5.6)

## 2023-02-07 MED ORDER — ROSUVASTATIN CALCIUM 5 MG PO TABS: 5.0000 mg | ORAL_TABLET | Freq: Every day | ORAL | 3 refills | Status: DC

## 2023-02-07 MED ORDER — TRULICITY 3 MG/0.5ML ~~LOC~~ SOAJ: 3.0000 mg | SUBCUTANEOUS | 3 refills | Status: DC

## 2023-02-07 NOTE — Patient Instructions (Addendum)
Please continue: - Metformin 2000 mg with dinner - Tresiba 10 units daily  Try to increase: - Trulicity 3 mg weekly   Please return in 4 months with your sugar log

## 2023-02-07 NOTE — Progress Notes (Signed)
Patient ID: Shelly Morgan, female   DOB: September 20, 1960, 62 y.o.   MRN: [id number]   HPI: Shelly Morgan is a 62 y.o.-year-old female, initially referred by her PCP, Manon Hilding, PA, for management of DM2, dx in 2008, GDM initially in 2002, insulin-dependent, uncontrolled, without long term complications. Last visit 4 months ago.  Interim history: At last visit she was going to the gym, doing mostly cardio. No increased urination, blurry vision, nausea, chest pain. She had a steroid inj this summer (1 mo ago) >> sugars higher. She also had Covid 12/2022.  Reviewed HbA1c levels: Lab Results  Component Value Date   HGBA1C 7.7 (A) 10/02/2022   HGBA1C 6.5 (A) 04/03/2022   HGBA1C 7.4 (A) 07/25/2021   HGBA1C 5.9 (A) 01/17/2021   HGBA1C 6.5 (A) 04/26/2020   HGBA1C 7.1 (A) 12/20/2019   HGBA1C 6.8 (A) 08/20/2019   HGBA1C 6.5 (A) 04/21/2019   HGBA1C 7.0 (A) 12/15/2018   HGBA1C 8.4 (A) 08/25/2018   HGBA1C 7.4 (A) 12/02/2017   HGBA1C 6.8 09/01/2017   HGBA1C 6.7 05/28/2017   HGBA1C 6.6 02/06/2017   HGBA1C 6.6 11/01/2016   HGBA1C 6.4 06/25/2016   HGBA1C 7.2 02/02/2016   HGBA1C 8.3 10/23/2015  08/05/2022: HbA1c 6.5% 07/26/2020: HbA1c 6.5% 01/25/2016: 7.4% 04/24/2015: HbA1c 7.9% 04/21/2014: HbA1c 6.9%  She is on: - Metformin 2000 mg with dinner - Trulicity 3 >> 1.5 mg weekly - Tresiba 14 >> 16 >> 10 units daily  She was on Jardiance 25 mg before b'fast. - started 03/2016 - had 1 yeast inf., but continued to have labial itching >> on a steroid cream + Azo cream 3x a day >> but this did not help so she stopped Jardiance. She tried Trulicity before (2017) and developed nausea and GERD.  No side effects now. Victoza was not covered. She was previously on glipizide, stopped in 01/2021.  Pt checks her sugars 1-2 times a day: - am: 98-131, 141 >> 86-137 >> 101-190, ave: 130 >> 110-138 - 2h after b'fast: 117-162 >> 85-132 >> 131-137 >> 103-141 - before lunch: 91-133 >> 88-108 >> 79-123 >>  89-97 >> 97-99, 130 - 2h after lunch:  121-141 >> 121-137 >> 130s >> 113-161 - before dinner: 96-111 >> 82-112 >> 80s-100 >> 98-155 - 2h after dinner: 130-157, 184 >> 129-156 >> 130s >> 137-156 - bedtime:  93-151 >> n/c >> 117-147, 174 >> 130s >> n/c - nighttime:   135 >> n/c >> 118 >> 96 > n/c Lowest sugar was 79 >> 76 >> 89; she has hypoglycemia awareness in the 90s. Highest sugar was 174 >> 190 >> 200s - steroids.  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  Mild CKD, last BUN/creatinine:  Basic Metabolic Reviewed date:08/01/2022 03:54:15 PM Interpretation:Abnormal Performing Lab: Notes/Report: Testing Performed at: [facility], [address], [address], [place], [place] [address]  Glucose 152 70-99 mg/dL    BUN 17 5-63 mg/dL    Creatinine 8.75 6.43-3.29 mg/dl    [id number] 61 >06 calc In accordance with recommendations from NKF-ASN Task Force, [facility] has updated its eGFR calc to the 2021 CKD-EDI equation that estimates kidney function without a race variable;Stage 1 > 90 ML/Min plus Albuminuria;Stage 2 60-89 ML/MIN;Stage 3 30-59 ML/MIN;Stage 4 15-29 ML/MIN;Stage 5 <15 ML/MIN  Sodium 138 136-145 mmol/L    Potassium 4.4 3.5-5.5 mmol/L    Chloride 104 98-107 mmol/L    CO2 28 22-32 mmol/L    Anion Gap 10.9 6.0-20.0 mmol/L  Calcium 9.8 8.6-10.3 mg/dL    Albumin 4.3 9.6-0.4 g/dL    CA-corrected 5.40 9.81-19.14 mg/dL         Microalbumin Panel Reviewed date:08/01/2022 09:15:03 PM Interpretation:nml Performing Lab: Notes/Report: Testing Performed at: [facility], [address], [address], [place], [place] [address]  MA/CR ratio <17.1 0.0-30.0 mg/G    UMA <0.7      UCR 41    11/05/2021: Glucose 92, GFR 67, BUN/creatinine 19/0.96 07/26/2020: 16/0.88,GFR 65, Glu 77 Lab Results  Component Value Date   BUN 12 04/21/2019   BUN 19 06/25/2016   CREATININE 1.02 04/21/2019   CREATININE 1.03 06/25/2016  07/27/2018: 18/1.04, GFR 61 05/15/2017: CMP normal,  except glucose 171.  BUN/creatinine 20/1.01, GFR 56. Not on ACE inhibitor/ARB.  + HL; latest Lipids: Lipid Panel w/reflex Reviewed date:08/01/2022 03:41:18 PM Interpretation:At goal Performing Lab: Notes/Report: Testing Performed at: [facility], [address], [place], [place] [address]  Cholesterol 134 <200 mg/dL    CHOL/HDL 2.5 8.6-5.7 Ratio    HDLD 53 30-85 mg/dL Values below 40 mg/dL indicate increased risk factor  Triglyceride 118 0-199 mg/dL    NHDL 81 8-469 mg/dL Range dependent upon risk factors.  LDL Chol Calc (NIH) 60 0-99 mg/dL     Lab Results  Component Value Date   CHOL 144 04/21/2019   HDL 45.50 04/21/2019   LDLCALC 72 04/21/2019   TRIG 130.0 04/21/2019   CHOLHDL 3 04/21/2019  She was previously on Lipitor.  We started Crestor 5 mg daily in 2020 >> no side effects  - last eye exam was 09/24/2022: No DR reportedly- Dr. Shea Evans.  - no numbness and tingling in her feet.  Last foot exam 04/03/2022. On ASA 81.  Daughter studies dancing in [place] - started the Fall of 2020.  ROS: + see HPI  I reviewed pt's medications, allergies, PMH, social hx, family hx, and changes were documented in the history of present illness. Otherwise, unchanged from my initial visit note.  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    Social History Main Topics   Smoking status: Former Smoker    Types: Cigarettes    Quit date: 1997   Smokeless tobacco: Not on file   Alcohol use Yes     Comment: 1-2 year   Drug use: no   Current Outpatient Medications on File Prior to Visit  Medication Sig Dispense Refill   Blood Glucose Monitoring Suppl (ONETOUCH VERIO FLEX SYSTEM)  w/Device KIT Use as advised 1 kit 0   diphenhydrAMINE (BENADRYL) 25 mg capsule Take 25 mg by mouth 2 (two) times daily.     Dulaglutide (TRULICITY) 1.5 MG/0.5ML SOPN inject 1.5 MG into THE SKIN ONCE WEEKLY 2 mL 11   GLOBAL EASE INJECT PEN NEEDLES 32G X 4 MM MISC USE AS DIRECTED TO inject insulin 200 each PRN   Lancets (ONETOUCH DELICA PLUS LANCET33G) MISC USE AS DIRECTED TO TEST TWICE DAILY 200 each 3   metFORMIN (GLUCOPHAGE) 1000 MG tablet take 1 Tablet by mouth twice daily WITH meals 180 tablet 1   ONETOUCH VERIO test strip USE AS DIRECTED TO TEST TWICE DAILY 200 strip 3  TRESIBA FLEXTOUCH 100 UNIT/ML FlexTouch Pen Inject 10 units every night 15 mL PRN   No current facility-administered medications on file prior to visit.   Allergies  Allergen Reactions   Codeine Nausea And Vomiting   Morphine And Codeine Nausea And Vomiting   Family History  Problem Relation Age of Onset   COPD Mother    Diverticulitis Father    Crohn's disease Brother    PGF and F with DM.  PE: BP 124/80   Pulse 74   Ht 5\' 8"  (1.727 m)   Wt 195 lb 12.8 oz (88.8 kg)   SpO2 99%   BMI 29.77 kg/m  Wt Readings from Last 3 Encounters:  02/07/23 195 lb 12.8 oz (88.8 kg)  10/02/22 191 lb 6.4 oz (86.8 kg)  04/03/22 195 lb 6.4 oz (88.6 kg)   Constitutional: overweight, in NAD Eyes:  EOMI, no exophthalmos ENT: no neck masses, no cervical lymphadenopathy Cardiovascular: RRR, No MRG Respiratory: CTA B Musculoskeletal: no deformities Skin:no rashes Neurological: no tremor with outstretched hands Diabetic Foot Exam - Simple   Simple Foot Form Diabetic Foot exam was performed with the following findings: Yes 02/07/2023  8:19 AM  Visual Inspection No deformities, no ulcerations, no other skin breakdown bilaterally: Yes Sensation Testing Intact to touch and monofilament testing bilaterally: Yes Pulse Check Posterior Tibialis and Dorsalis pulse intact bilaterally: Yes Comments    ASSESSMENT: 1. DM2,  insulin-dependent, uncontrolled, without long term complications, but with hyperglycemia  In 2019, we ruled out type 1 diabetes: Component     Latest Ref Rng & Units 09/01/2017  Hemoglobin A1C      6.8  C-Peptide     0.80 - 3.85 ng/mL 2.87  Islet Cell Ab     Neg:<1:1 Negative  Glutamic Acid Decarb Ab     <5 IU/mL <5  Glucose, Plasma     65 - 99 mg/dL 413 (H)  ZNT8 Antibodies     U/mL <15   2. Overweight  3. HL  PLAN:  1. Patient with longstanding, uncontrolled, type 2 diabetes, on oral antidiabetic regimen with metformin and also long-acting insulin low-dose and weekly GLP-1 receptor agonist, with previously improved control but the higher HbA1c at last visit: 7.7%.  Sugars were slightly higher than goal in the morning but they were at goal later in the day.  Therefore, they were not correlating well with a higher HbA1c, however, she did report higher blood sugars around the time of her steroid injection previously.  We did discuss about changing her glucometer which she had for a long time but otherwise I did not suggest a change in regimen. -At today's visit, sugars are at or slightly above goal in the morning but they are fairly well-controlled later in the day.  She is interested in possibly going back to the previous dose of Trulicity, 3 mg weekly, which we had to decrease previously due to lack of availability at the pharmacy.  Will increase the dose now and continue metformin and Tresiba.  HbA1c today is higher than expected from her log so we will check a fructosamine level, also. - I suggested to:  Patient Instructions  Please continue: - Metformin 2000 mg with dinner - Tresiba 10 units daily  Try to increase: - Trulicity 3 mg weekly   Please return in 4 months with your sugar log  - we checked her HbA1c: 7.5% (higher) -but not correlating well with sugars at home - advised to check sugars at different times of  the day - 1x a day, rotating check times - advised for yearly  eye exams >> she is UTD - return to clinic in 4 months   2. Overweight -Continues Trulicity which should also help with weight loss -She lost a significant amount of weight by eating a more plant-based diet, doing intermittent fasting and eating smaller portions and increasing exercise -Per [facility] records, she lost 47 pounds between 07/2018 (224 pounds) to 07/2021 (177 pounds) -Before last visit, she gained 18 pounds-part of it probably muscle as she was doing more weight training -Before last visit, she lost 4 pounds, previously gained 18 (possibly some muscle weight as she was doing more weight training) -At today's visit, weight is up by 4 pounds  3. HL -Latest lipid panel from 2024 showed that all fractions were at goal -Will continue Crestor 5 mg daily.  No side effects.  I refilled this for her today.  Carlus Pavlov, MD PhD [facility]

## 2023-02-10 LAB — FRUCTOSAMINE: Fructosamine: 259 umol/L (ref 205–285)

## 2023-04-03 ENCOUNTER — Other Ambulatory Visit: Payer: Self-pay | Admitting: Internal Medicine

## 2023-04-07 ENCOUNTER — Other Ambulatory Visit (HOSPITAL_COMMUNITY): Payer: Self-pay

## 2023-04-07 ENCOUNTER — Telehealth: Payer: Self-pay

## 2023-04-07 ENCOUNTER — Other Ambulatory Visit: Payer: Self-pay

## 2023-04-07 MED ORDER — TRESIBA FLEXTOUCH 100 UNIT/ML ~~LOC~~ SOPN: PEN_INJECTOR | SUBCUTANEOUS | 1 refills | Status: DC

## 2023-04-07 NOTE — Telephone Encounter (Signed)
Pharmacy Patient Advocate Encounter   Received notification from Pt calls messages that prior authorization for Trulicity is required/requested.   Insurance verification completed.   The patient is insured through [facility] .   Per test claim: PA required; PA submitted to above mentioned insurance via CoverMyMeds Key/confirmation #/EOC [id number] Status is pending

## 2023-04-07 NOTE — Telephone Encounter (Signed)
Requested Prescriptions   Signed Prescriptions Disp Refills   insulin degludec (TRESIBA FLEXTOUCH) 100 UNIT/ML FlexTouch Pen 15 mL 1    Sig: INJECT 10 UNITS EVERY NIGHT    Authorizing Provider: Carlus Pavlov    Ordering User: Pollie Meyer

## 2023-04-07 NOTE — Telephone Encounter (Signed)
Patient needs PA for Trulicity 3 mg

## 2023-04-09 NOTE — Telephone Encounter (Signed)
Pharmacy Patient Advocate Encounter  Received notification from [facility] that Prior Authorization for Trulicity has been APPROVED through 04/05/2026   PA #/Case ID/Reference #: [id number]

## 2023-06-18 ENCOUNTER — Encounter: Payer: Self-pay | Admitting: Internal Medicine

## 2023-06-18 ENCOUNTER — Ambulatory Visit: Payer: Self-pay | Admitting: Internal Medicine

## 2023-06-18 VITALS — BP 120/70 | HR 75 | Ht 68.0 in | Wt 199.2 lb

## 2023-06-18 DIAGNOSIS — E663 Overweight: Secondary | ICD-10-CM

## 2023-06-18 DIAGNOSIS — E785 Hyperlipidemia, unspecified: Secondary | ICD-10-CM | POA: Diagnosis not present

## 2023-06-18 DIAGNOSIS — E1165 Type 2 diabetes mellitus with hyperglycemia: Secondary | ICD-10-CM

## 2023-06-18 DIAGNOSIS — Z7985 Long-term (current) use of injectable non-insulin antidiabetic drugs: Secondary | ICD-10-CM

## 2023-06-18 DIAGNOSIS — Z7984 Long term (current) use of oral hypoglycemic drugs: Secondary | ICD-10-CM

## 2023-06-18 DIAGNOSIS — Z794 Long term (current) use of insulin: Secondary | ICD-10-CM | POA: Diagnosis not present

## 2023-06-18 LAB — POCT GLYCOSYLATED HEMOGLOBIN (HGB A1C): Hemoglobin A1C: 7.2 % — AB (ref 4.0–5.6)

## 2023-06-18 MED ORDER — TRULICITY 3 MG/0.5ML ~~LOC~~ SOAJ: 3.0000 mg | SUBCUTANEOUS | 3 refills | Status: DC

## 2023-06-18 MED ORDER — TRESIBA FLEXTOUCH 100 UNIT/ML ~~LOC~~ SOPN: 15.0000 [IU] | PEN_INJECTOR | Freq: Every day | SUBCUTANEOUS | 3 refills | Status: AC

## 2023-06-18 MED ORDER — METFORMIN HCL 1000 MG PO TABS: 2000.0000 mg | ORAL_TABLET | Freq: Every day | ORAL | 3 refills | Status: AC

## 2023-06-18 NOTE — Progress Notes (Signed)
 Patient ID: Shelly Morgan, female   DOB: 1961/04/21, 63 y.o.   MRN: [id number]   HPI: Shelly Morgan is a 63 y.o.-year-old female, initially referred by her PCP, Shelly Nail, PA, for management of DM2, dx in 2008, GDM initially in 2002, insulin -dependent, uncontrolled, without long term complications. Last visit 4 months ago.  Interim history: She was going to the gym, doing mostly cardio but then also weights. Now L hip pain >> did not exercise in 2 weeks. Also got a steroid inj 04/2023 >> sugars much higher: 200s.  She needs to have these every 4 months. No increased urination, blurry vision, nausea, chest pain.  Reviewed HbA1c levels: 02/07/2023: HbA1c calculated from fructosamine is much better than the directly measured HbA1c, at 6.0%! Lab Results  Component Value Date   HGBA1C 7.5 (A) 02/07/2023   HGBA1C 7.7 (A) 10/02/2022   HGBA1C 6.5 (A) 04/03/2022   HGBA1C 7.4 (A) 07/25/2021   HGBA1C 5.9 (A) 01/17/2021   HGBA1C 6.5 (A) 04/26/2020   HGBA1C 7.1 (A) 12/20/2019   HGBA1C 6.8 (A) 08/20/2019   HGBA1C 6.5 (A) 04/21/2019   HGBA1C 7.0 (A) 12/15/2018   HGBA1C 8.4 (A) 08/25/2018   HGBA1C 7.4 (A) 12/02/2017   HGBA1C 6.8 09/01/2017   HGBA1C 6.7 05/28/2017   HGBA1C 6.6 02/06/2017   HGBA1C 6.6 11/01/2016   HGBA1C 6.4 06/25/2016   HGBA1C 7.2 02/02/2016   HGBA1C 8.3 10/23/2015  08/05/2022: HbA1c 6.5% 07/26/2020: HbA1c 6.5% 01/25/2016: 7.4% 04/24/2015: HbA1c 7.9% 04/21/2014: HbA1c 6.9%  She is on: - Metformin  2000 mg with dinner - Trulicity  3 >> 1.5 >> 3 mg weekly - Tresiba  14 >> 16 >> 10 >> 15 units daily She was on Jardiance  25 mg before b'fast. - started 03/2016 - had 1 yeast inf., but continued to have labial itching >> on a steroid cream + Azo cream 3x a day >> but this did not help so she stopped Jardiance . She tried Trulicity  before (2017) and developed nausea and GERD.  No side effects now. Victoza was not covered. She was previously on glipizide , stopped in  01/2021.  Pt checks her sugars 1-2 times a day: - am: 98-131, 141 >> 86-137 >> 101-190, ave: 130 >> 110-138 >> 96-136, 165, steroid: 203-235 - 2h after b'fast: 117-162 >> 85-132 >> 131-137 >> 103-141 >> 111-137 - before lunch: 88-108 >> 79-123 >> 89-97 >> 97-99, 130 >> 98-103 - 2h after lunch:  121-141 >> 121-137 >> 130s >> 113-161 >>  126-157 - before dinner: 96-111 >> 82-112 >> 80s-100 >> 98-155 >> 92-100 - 2h after dinner: 130-157, 184 >> 129-156 >> 130s >> 137-156 >> 127-142 - bedtime:  93-151 >> n/c >> 117-147, 174 >> 130s >> n/c - nighttime:   135 >> n/c >> 118 >> 96 > n/c Lowest sugar was 79 >> 76 >> 89; she has hypoglycemia awareness in the 90s. Highest sugar was 174 >> 190 >> 200s - steroids.  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  Mild CKD, last BUN/creatinine:  Basic Metabolic Reviewed date:08/01/2022 03:54:15 PM Interpretation:Abnormal Performing Lab: Notes/Report: Testing Performed at: [facility], [address], [place], [place] [address]  Glucose 152 70-99 mg/dL    BUN 17 0-86 mg/dL    Creatinine 5.78 4.69-6.29 mg/dl    [id number] 61 >40 calc In accordance with recommendations from NKF-ASN Task Force, [facility] has updated its eGFR calc to the 2021 CKD-EDI equation that estimates kidney function without a race variable;Stage 1 >  90 ML/Min plus Albuminuria;Stage 2 60-89 ML/MIN;Stage 3 30-59 ML/MIN;Stage 4 15-29 ML/MIN;Stage 5 <15 ML/MIN  Sodium 138 136-145 mmol/L    Potassium 4.4 3.5-5.5 mmol/L    Chloride 104 98-107 mmol/L    CO2 28 22-32 mmol/L    Anion Gap 10.9 6.0-20.0 mmol/L    Calcium  9.8 8.6-10.3 mg/dL    Albumin 4.3 6.5-7.8 g/dL    CA-corrected 4.69 6.29-52.84 mg/dL         Microalbumin Panel Reviewed date:08/01/2022 09:15:03 PM Interpretation:nml Performing Lab: Notes/Report: Testing Performed at: [facility], [address], [address], [place], [place] [address]  MA/CR ratio <17.1 0.0-30.0 mg/G    UMA <0.7       UCR 41     Lab Results  Component Value Date   BUN 12 04/21/2019   BUN 19 06/25/2016   CREATININE 1.02 04/21/2019   CREATININE 1.03 06/25/2016   Lab Results  Component Value Date   MICRALBCREAT 0.5 04/21/2019  Not on ACE inhibitor/ARB.  + HL; latest Lipids: Lipid Panel w/reflex Reviewed date:08/01/2022 03:41:18 PM Interpretation:At goal Performing Lab: Notes/Report: Testing Performed at: [facility], [address], [address], [place], [place] [address]  Cholesterol 134 <200 mg/dL    CHOL/HDL 2.5 2.5-3.6 Ratio    HDLD 53 30-85 mg/dL Values below 40 mg/dL indicate increased risk factor  Triglyceride 118 0-199 mg/dL    NHDL 81 6-440 mg/dL Range dependent upon risk factors.  LDL Chol Calc (NIH) 60 0-99 mg/dL     Lab Results  Component Value Date   CHOL 144 04/21/2019   HDL 45.50 04/21/2019   LDLCALC 72 04/21/2019   TRIG 130.0 04/21/2019   CHOLHDL 3 04/21/2019  She was previously on Lipitor.  We started Crestor  5 mg daily in 2020 >> no side effects  - last eye exam was 09/24/2022: No DR reportedly- Dr. Alto Morgan.  - no numbness and tingling in her feet.  Last foot exam 02/07/2023. On ASA 81.  Daughter studied dancing in [place] - started the Fall of 2020.  ROS: + see HPI  I reviewed pt's medications, allergies, PMH, social hx, family hx, and changes were documented in the history of present illness. Otherwise, unchanged from my initial visit note.  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    Social History Main Topics   Smoking status: Former Smoker    Types: Cigarettes    Quit date: 1997   Smokeless tobacco: Not on file   Alcohol use Yes     Comment: 1-2 year   Drug  use: no   Current Outpatient Medications on File Prior to Visit  Medication Sig Dispense Refill   Blood Glucose Monitoring Suppl (ONETOUCH VERIO FLEX SYSTEM) w/Device KIT Use as advised 1 kit 0   diphenhydrAMINE (BENADRYL) 25 mg capsule Take 25 mg by mouth 2 (two) times daily.     Dulaglutide  (TRULICITY ) 3 MG/0.5ML SOPN Inject 3 mg into the skin once a week. 6 mL 3   GLOBAL EASE INJECT PEN NEEDLES 32G X 4 MM MISC USE AS DIRECTED TO inject insulin  200 each PRN   insulin  degludec (TRESIBA  FLEXTOUCH) 100 UNIT/ML FlexTouch Pen INJECT 10 UNITS EVERY  NIGHT 15 mL 1   Lancets (ONETOUCH DELICA PLUS LANCET33G) MISC USE AS DIRECTED TO TEST TWICE DAILY 200 each 3   metFORMIN  (GLUCOPHAGE ) 1000 MG tablet take 1 Tablet by mouth twice daily WITH meals 180 tablet 3   ONETOUCH VERIO test strip USE AS DIRECTED TO TEST TWICE DAILY 200 strip 3   rosuvastatin  (CRESTOR ) 5 MG tablet Take 1 tablet (5 mg total) by mouth daily. 90 tablet 3   No current facility-administered medications on file prior to visit.   Allergies  Allergen Reactions   Codeine Nausea And Vomiting   Morphine And Codeine Nausea And Vomiting   Family History  Problem Relation Age of Onset   COPD Mother    Diverticulitis Father    Crohn's disease Brother    PGF and F with DM.  PE: BP 120/70   Pulse 75   Ht 5\' 8"  (1.727 m)   Wt 199 lb 3.2 oz (90.4 kg)   SpO2 97%   BMI 30.29 kg/m  Wt Readings from Last 3 Encounters:  06/18/23 199 lb 3.2 oz (90.4 kg)  02/07/23 195 lb 12.8 oz (88.8 kg)  10/02/22 191 lb 6.4 oz (86.8 kg)   Constitutional: overweight, in NAD Eyes:  EOMI, no exophthalmos ENT: no neck masses, no cervical lymphadenopathy Cardiovascular: RRR, No MRG Respiratory: CTA B Musculoskeletal: no deformities Skin:no rashes Neurological: no tremor with outstretched hands  ASSESSMENT: 1. DM2, insulin -dependent, uncontrolled, without long term complications, but with hyperglycemia  In 2019, we ruled out type 1  diabetes: Component     Latest Ref Rng & Units 09/01/2017  Hemoglobin A1C      6.8  C-Peptide     0.80 - 3.85 ng/mL 2.87  Islet Cell Ab     Neg:<1:1 Negative  Glutamic Acid Decarb Ab     <5 IU/mL <5  Glucose, Plasma     65 - 99 mg/dL 045 (H)  ZNT8 Antibodies     U/mL <15   2. Overweight  3. HL  PLAN:  1. Patient with longstanding, uncontrolled, type 2 diabetes on oral antidiabetic regimen with metformin  but also long-acting insulin  and weekly GLP-1 receptor agonist, increased at last visit.  Sugars were at or slightly above target in the morning but they were fairly well-controlled later in the day.  She was interested in going back to the higher dose of Trulicity , 3 mg weekly, which we previously had to stop due to lack of availability at the pharmacy.  We continued the rest of the regimen.  HbA1c appears to be higher, at 7.5% but this was not correlating with her blood sugars at home.  Indeed, HbA1c calculated from fructosamine was much better, at 6.0%. -At today's visit, sugars are mostly at goal, but she had very high blood sugars in the morning, in the 200s, after steroid injection at the beginning of 04/2023.  She will have another injection tomorrow.  She already increased her Tresiba  dose and we discussed that for a period of time of 4 to 5 days, she may need to increase the Tresiba  dose further when she is having the steroid injection.  Otherwise, we can continue the current regimen.  I refilled her prescriptions. - I suggested to:  Patient Instructions  Please continue: - Metformin  2000 mg with dinner - Tresiba  15 units daily (may need 20 units while on steroids) - Trulicity  3 mg weekly   Please return in 4 months with your sugar log  - we checked her HbA1c: 7.2% (  lower) - advised to check sugars at different times of the day - 1x a day, rotating check times - advised for yearly eye exams >> she is UTD - return to clinic in 4 months   2. Overweight -At last visit, we  continued Trulicity  but increase the dose.  This should also help with weight loss -She lost a significant amount of weight by eating a more plant-based diet, doing intermittent fasting and eating smaller portions and increasing exercise -Per [facility] records, she lost 47 pounds between 07/2018 (224 pounds) to 07/2021 (177 pounds).  Afterwards, she gained almost 20 pounds back.  She did start weight training so she did build muscle afterwards. -At last visit, weight was up by 4 pounds and at today's visit, is up by 4 more lbs  3. HL -Latest lipid panel from last year showed that all fractions were at goal -He continues on Crestor  5 mg daily  -no side effects  Emilie Harden, MD PhD [facility]

## 2023-06-18 NOTE — Patient Instructions (Addendum)
 Please continue: - Metformin  2000 mg with dinner - Tresiba  15 units daily (may need 20 units while on steroids) - Trulicity  3 mg weekly   Please return in 4 months with your sugar log

## 2023-06-27 ENCOUNTER — Telehealth: Payer: Self-pay

## 2023-06-27 MED ORDER — ONETOUCH VERIO VI STRP: ORAL_STRIP | 3 refills | Status: DC

## 2023-06-27 NOTE — Telephone Encounter (Signed)
Requested Prescriptions   Signed Prescriptions Disp Refills   ONETOUCH VERIO test strip 200 strip 3    Sig: Use as directed to test twice daily    Authorizing Provider: Carlus Pavlov    Ordering User: Pollie Meyer

## 2023-08-29 ENCOUNTER — Other Ambulatory Visit: Payer: Self-pay

## 2023-09-03 LAB — SURGICAL PATHOLOGY

## 2023-09-29 ENCOUNTER — Other Ambulatory Visit: Payer: Self-pay | Admitting: Internal Medicine

## 2023-11-11 ENCOUNTER — Ambulatory Visit: Payer: 59 | Admitting: Internal Medicine

## 2023-11-11 ENCOUNTER — Encounter: Payer: Self-pay | Admitting: Internal Medicine

## 2023-11-11 VITALS — BP 122/70 | HR 71 | Ht 68.0 in | Wt 197.0 lb

## 2023-11-11 DIAGNOSIS — E1165 Type 2 diabetes mellitus with hyperglycemia: Secondary | ICD-10-CM | POA: Diagnosis not present

## 2023-11-11 DIAGNOSIS — E785 Hyperlipidemia, unspecified: Secondary | ICD-10-CM | POA: Diagnosis not present

## 2023-11-11 DIAGNOSIS — Z7984 Long term (current) use of oral hypoglycemic drugs: Secondary | ICD-10-CM | POA: Diagnosis not present

## 2023-11-11 DIAGNOSIS — E663 Overweight: Secondary | ICD-10-CM | POA: Diagnosis not present

## 2023-11-11 DIAGNOSIS — Z7985 Long-term (current) use of injectable non-insulin antidiabetic drugs: Secondary | ICD-10-CM | POA: Diagnosis not present

## 2023-11-11 DIAGNOSIS — Z794 Long term (current) use of insulin: Secondary | ICD-10-CM

## 2023-11-11 LAB — POCT GLYCOSYLATED HEMOGLOBIN (HGB A1C): Hemoglobin A1C: 6.9 % — AB (ref 4.0–5.6)

## 2023-11-11 NOTE — Patient Instructions (Addendum)
 Please continue: - Metformin  2000 mg with dinner - Tresiba  15 units daily (20 units while on steroids) - Trulicity  3 mg weekly   Please return in 4-6 months with your sugar log

## 2023-11-11 NOTE — Progress Notes (Signed)
 Patient ID: Shelly Morgan, female   DOB: 01-Nov-1960, 63 y.o.   MRN: [id number]   HPI: Shelly Morgan is a 63 y.o.-year-old female, initially referred by her PCP, Johnice Nail, PA, for management of DM2, dx in 2008, GDM initially in 2002, insulin -dependent, uncontrolled, without long term complications. Last visit 5 months ago.  Interim history: She continues exercise-cardio, strength.  She has left hip pain for which she gets steroid injections every 3 months. Last inj was 4 mo ago. No increased urination, blurry vision, nausea, chest pain. She changed her diet >> no fried foods, no starches, no processed foods.  Sugars improved.  Reviewed HbA1c levels: Lab Results  Component Value Date   HGBA1C 7.2 (A) 06/18/2023   HGBA1C 7.5 (A) 02/07/2023   HGBA1C 7.7 (A) 10/02/2022   HGBA1C 6.5 (A) 04/03/2022   HGBA1C 7.4 (A) 07/25/2021   HGBA1C 5.9 (A) 01/17/2021   HGBA1C 6.5 (A) 04/26/2020   HGBA1C 7.1 (A) 12/20/2019   HGBA1C 6.8 (A) 08/20/2019   HGBA1C 6.5 (A) 04/21/2019   HGBA1C 7.0 (A) 12/15/2018   HGBA1C 8.4 (A) 08/25/2018   HGBA1C 7.4 (A) 12/02/2017   HGBA1C 6.8 09/01/2017   HGBA1C 6.7 05/28/2017   HGBA1C 6.6 02/06/2017   HGBA1C 6.6 11/01/2016   HGBA1C 6.4 06/25/2016   HGBA1C 7.2 02/02/2016   HGBA1C 8.3 10/23/2015  02/07/2023: HbA1c calculated from fructosamine is much better than the directly measured HbA1c, at 6.0%! 08/05/2022: HbA1c 6.5% 07/26/2020: HbA1c 6.5% 01/25/2016: 7.4% 04/24/2015: HbA1c 7.9% 04/21/2014: HbA1c 6.9%  She is on: - Metformin  2000 mg with dinner - Trulicity  3 >> 1.5 >> 3 mg weekly - Tresiba  14 >> 16 >> 10 >> 15 (20) units daily She was on Jardiance  25 mg before b'fast. - started 03/2016 - had 1 yeast inf., but continued to have labial itching >> on a steroid cream + Azo cream 3x a day >> but this did not help so she stopped Jardiance . She tried Trulicity  before (2017) and developed nausea and GERD.  No side effects now. Victoza was not covered. She  was previously on glipizide , stopped in 01/2021.  Pt checks her sugars 1-2 times a day: - am: 110-138 >> 96-136, 165, steroid: 203-235 >> 74-135 - 2h after b'fast: 131-137 >> 103-141 >> 111-137 >> 107-137, 181 - before lunch: 89-97 >> 97-99, 130 >> 98-103 >> 96-98 - 2h after lunch: 130s >> 113-161 >>  126-157 >> 122-134, 233 - before dinner: 80s-100 >> 98-155 >> 92-100 >> 98-101 - 2h after dinner:  130s >> 137-156 >> 127-142 >> 120-193 - bedtime: n/c >> 117-147, 174 >> 130s >> n/c - nighttime:   135 >> n/c >> 118 >> 96 > n/c Lowest sugar was 79 >> 76 >> 89 >> 79; she has hypoglycemia awareness in the 90s. Highest sugar was 174 >> 190 >> 200s - steroids, 260 - steroids.  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 Now meals are: - 11 am: 2 hard boiled eggs + salad - snacks: cucumbers, celery, berries, apples - dinner: low carb meal No fried foods, no starches  Mild CKD, last BUN/creatinine:  08/13/2023: 14/0.92, GFR 70, glucose 118      Microalbumin Panel Reviewed date:08/01/2022 09:15:03 PM Interpretation:nml Performing Lab: Notes/Report: Testing Performed at: [facility], [address], [address], [place], [place] [address]  MA/CR ratio <17.1 0.0-30.0 mg/G    UMA <0.7      UCR 41     Lab Results  Component  Value Date   BUN 12 04/21/2019   BUN 19 06/25/2016   CREATININE 1.02 04/21/2019   CREATININE 1.03 06/25/2016   Lab Results  Component Value Date   MICRALBCREAT 0.5 04/21/2019  Not on ACE inhibitor/ARB.  + HL; latest Lipids: 08/13/2023: 151/166/60/64 Lipid Panel w/reflex Reviewed date:08/01/2022 03:41:18 PM Interpretation:At goal Performing Lab: Notes/Report: Testing Performed at: [facility], [address], [address], [place], [place] [address]  Cholesterol 134 <200 mg/dL    CHOL/HDL 2.5 6.0-4.5 Ratio    HDLD 53 30-85 mg/dL Values below 40 mg/dL indicate increased risk factor  Triglyceride 118 0-199 mg/dL    NHDL 81 4-098 mg/dL  Range dependent upon risk factors.  LDL Chol Calc (NIH) 60 0-99 mg/dL     Lab Results  Component Value Date   CHOL 144 04/21/2019   HDL 45.50 04/21/2019   LDLCALC 72 04/21/2019   TRIG 130.0 04/21/2019   CHOLHDL 3 04/21/2019  She was previously on Lipitor.  We started Crestor  5 mg daily in 2020 >> no side effects  - last eye exam was 09/24/2022: No DR reportedly- Dr. Alto Atta. Will change Drs.  - no numbness and tingling in her feet.  Last foot exam 02/07/2023. On ASA 81.  Daughter studied dancing in [place] - started the Fall of 2020.  ROS: + see HPI  I reviewed pt's medications, allergies, PMH, social hx, family hx, and changes were documented in the history of present illness. Otherwise, unchanged from my initial visit note.  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 She changed from [employer] to Blue [employer] Blue Shield insurance then.    Social History Main Topics   Smoking status: Former Smoker    Types: Cigarettes    Quit date: 1997   Smokeless tobacco: Not on file   Alcohol use Yes     Comment: 1-2 year   Drug use: no   Current Outpatient Medications on File Prior to Visit  Medication Sig Dispense Refill   BD PEN NEEDLE NANO 2ND GEN 32G X 4 MM MISC USE AS DIRECTED TO INJECT INSULIN  EVERY DAY 100 each PRN   Blood Glucose Monitoring Suppl (ONETOUCH VERIO FLEX SYSTEM) w/Device KIT Use as advised 1 kit 0   diphenhydrAMINE (BENADRYL) 25 mg capsule Take 25 mg by mouth 2 (two) times daily.     Dulaglutide  (TRULICITY ) 3 MG/0.5ML SOAJ Inject 3 mg into the skin once a week. 6 mL 3   insulin  degludec (TRESIBA  FLEXTOUCH) 100 UNIT/ML FlexTouch Pen Inject 15-20 Units into the skin daily. INJECT 10 UNITS EVERY NIGHT 30 mL 3   Lancets (ONETOUCH DELICA PLUS  LANCET33G) MISC USE AS DIRECTED TO TEST TWICE DAILY 200 each 3   metFORMIN  (GLUCOPHAGE ) 1000 MG tablet Take 2 tablets (2,000 mg total) by mouth daily with supper. 180 tablet 3   ONETOUCH VERIO test strip Use as directed to test twice daily 200 strip 3   rosuvastatin  (CRESTOR ) 5 MG tablet Take 1 tablet (5 mg total) by mouth daily. 90 tablet 3   No current facility-administered medications on file prior to visit.   Allergies  Allergen Reactions   Codeine Nausea And Vomiting   Morphine And Codeine Nausea And Vomiting   Family History  Problem Relation Age of Onset   COPD Mother    Diverticulitis Father    Crohn's disease Brother    PGF and F with DM.  PE: BP 122/70   Pulse 71   Ht 5\' 8"  (1.727 m)   Wt 197 lb (89.4 kg)   SpO2 95%   BMI 29.95 kg/m  Wt Readings from Last 3 Encounters:  11/11/23 197 lb (89.4 kg)  06/18/23 199 lb 3.2 oz (90.4 kg)  02/07/23 195 lb 12.8 oz (88.8 kg)   Constitutional: overweight, in NAD Eyes:  EOMI, no exophthalmos ENT: no neck masses, no cervical lymphadenopathy Cardiovascular: RRR, No MRG Respiratory: CTA B Musculoskeletal: no deformities Skin:no rashes Neurological: no tremor with outstretched hands Diabetic Foot Exam - Simple   Simple Foot Form Diabetic Foot exam was performed with the following findings: Yes 11/11/2023  8:15 AM  Visual Inspection No deformities, no ulcerations, no other skin breakdown bilaterally: Yes Sensation Testing Intact to touch and monofilament testing bilaterally: Yes Pulse Check Posterior Tibialis and Dorsalis pulse intact bilaterally: Yes Comments    ASSESSMENT: 1. DM2, insulin -dependent, uncontrolled, without long term complications, but with hyperglycemia  In 2019, we ruled out type 1 diabetes: Component     Latest Ref Rng & Units 09/01/2017  Hemoglobin A1C      6.8  C-Peptide     0.80 - 3.85 ng/mL 2.87  Islet Cell Ab     Neg:<1:1 Negative  Glutamic Acid Decarb Ab     <5 IU/mL <5  Glucose,  Plasma     65 - 99 mg/dL 366 (H)  ZNT8 Antibodies     U/mL <15   2. Overweight  3. HL  PLAN:  1. Patient with longstanding, uncontrolled type 2 diabetes, on oral antidiabetic regimen with metformin  and also long-acting insulin  and weekly GLP-1 receptor agonist, with improving blood sugars.  At last visit, sugars were mostly at goal except for when she had a steroid injection in 04/2023 and blood sugars in the morning were in the 200s.  HbA1c was lower, at 7.2%, however, her HbA1c levels calculated from fructosamine are usually better.  Latest was 6.0% in 02/2023.  At last visit, we did not change her regimen but I did advise her that she may need to increase the dose of insulin  if she got another steroid injection. -at today's visit, sugars are mostly at goal, improved, with only occasional hyperglycemic excursions.  She is doing a great job with her diet and also exercises consistently.  No need to change her regimen for now.  She will continue to use a slightly higher dose of Tresiba  when she gets steroids. - i advised her to: Patient Instructions  Please continue: - Metformin  2000 mg with dinner - Tresiba  15 units daily (may need 20 units while on steroids) - Trulicity  3 mg weekly   Please return in 4 months with your sugar log  - we checked her HbA1c: 6.9% (lower) - advised to check sugars at different times of the day - 1x a day, rotating check times - advised for yearly eye exams >> she is due - will check an ACR today - return to clinic in 4 months   2. Overweight -She continues on Trulicity  which should also help with weight loss -She lost a significant amount of weight by eating a more plant-based diet, doing intermittent fasting and eating smaller portions and increasing exercise -Per [place] records, she lost 47 pounds between 07/2018 (224 pounds) to 07/2021 (177 pounds).  Afterwards, she gained almost 20 pounds back.  She did start weight training so she build muscle  afterwards - She gained 8 pounds before the last 2 visits combined - She lost 2 pounds since last visit - She started to adjust her diet, doing a great job with this.  Also continues exercise.  3. HL - Reviewed latest lipid panel from 08/2023 showed fractions at goal with exception of a slightly elevated triglyceride level, at 166 -She continues Crestor  5 mg daily without side effects  Emilie Harden, MD PhD [facility]

## 2023-11-11 NOTE — Addendum Note (Signed)
 Addended by: Vernon Goodpasture on: 11/11/2023 08:36 AM   Modules accepted: Orders

## 2023-11-12 ENCOUNTER — Ambulatory Visit: Payer: Self-pay | Admitting: Internal Medicine

## 2023-11-12 LAB — MICROALBUMIN / CREATININE URINE RATIO
Creatinine, Urine: 90 mg/dL (ref 20–275)
Microalb Creat Ratio: 10 mg/g{creat} (ref <30)
Microalb, Ur: 0.9 mg/dL

## 2023-12-24 LAB — OPHTHALMOLOGY REPORT-SCANNED

## 2024-03-07 ENCOUNTER — Other Ambulatory Visit: Payer: Self-pay | Admitting: Internal Medicine

## 2024-03-27 ENCOUNTER — Encounter: Payer: Self-pay | Admitting: Internal Medicine

## 2024-03-27 DIAGNOSIS — E1165 Type 2 diabetes mellitus with hyperglycemia: Secondary | ICD-10-CM

## 2024-03-29 MED ORDER — ONETOUCH VERIO VI STRP: ORAL_STRIP | 12 refills | Status: DC

## 2024-03-29 MED ORDER — ONETOUCH DELICA PLUS LANCET33G MISC: 1.0000 | Freq: Two times a day (BID) | 3 refills | Status: DC

## 2024-03-29 NOTE — Addendum Note (Signed)
 Addended by: ARLOA JEOFFREY SAILOR on: 03/29/2024 09:46 AM   Modules accepted: Orders

## 2024-05-14 ENCOUNTER — Encounter: Payer: Self-pay | Admitting: Internal Medicine

## 2024-05-14 ENCOUNTER — Ambulatory Visit: Admitting: Internal Medicine

## 2024-05-14 VITALS — BP 120/70 | HR 72 | Ht 68.0 in | Wt 200.4 lb

## 2024-05-14 DIAGNOSIS — E785 Hyperlipidemia, unspecified: Secondary | ICD-10-CM

## 2024-05-14 DIAGNOSIS — Z7985 Long-term (current) use of injectable non-insulin antidiabetic drugs: Secondary | ICD-10-CM

## 2024-05-14 DIAGNOSIS — E1165 Type 2 diabetes mellitus with hyperglycemia: Secondary | ICD-10-CM

## 2024-05-14 DIAGNOSIS — E663 Overweight: Secondary | ICD-10-CM | POA: Diagnosis not present

## 2024-05-14 DIAGNOSIS — Z7984 Long term (current) use of oral hypoglycemic drugs: Secondary | ICD-10-CM

## 2024-05-14 DIAGNOSIS — Z794 Long term (current) use of insulin: Secondary | ICD-10-CM

## 2024-05-14 LAB — POCT GLYCOSYLATED HEMOGLOBIN (HGB A1C): Hemoglobin A1C: 7.4 % — AB (ref 4.0–5.6)

## 2024-05-14 MED ORDER — TIRZEPATIDE 5 MG/0.5ML ~~LOC~~ SOAJ: 5.0000 mg | SUBCUTANEOUS | 3 refills | Status: AC

## 2024-05-14 NOTE — Progress Notes (Signed)
 Patient ID: Stephana Morell, female   DOB: May 24, 1961, 63 y.o.   MRN: [id number]   HPI: Mercia Dowe is a 63 y.o.-year-old female, initially referred by her PCP, Harlene Aris, PA, for management of DM2, dx in 2008, GDM initially in 2002, insulin -dependent, uncontrolled, without long term complications. Last visit 6 months ago.  Interim history: No increased urination, blurry vision, nausea, chest pain. Before last visit, she started to changed her diet >> no fried foods, no starches, no processed foods.  She continues to try to improve diet. She continues to exercise doing both cardio and strength exercises.  She has left hip pain for which she gets steroid injections.  Reviewed HbA1c levels: Lab Results  Component Value Date   HGBA1C 6.9 (A) 11/11/2023   HGBA1C 7.2 (A) 06/18/2023   HGBA1C 7.5 (A) 02/07/2023   HGBA1C 7.7 (A) 10/02/2022   HGBA1C 6.5 (A) 04/03/2022   HGBA1C 7.4 (A) 07/25/2021   HGBA1C 5.9 (A) 01/17/2021   HGBA1C 6.5 (A) 04/26/2020   HGBA1C 7.1 (A) 12/20/2019   HGBA1C 6.8 (A) 08/20/2019   HGBA1C 6.5 (A) 04/21/2019   HGBA1C 7.0 (A) 12/15/2018   HGBA1C 8.4 (A) 08/25/2018   HGBA1C 7.4 (A) 12/02/2017   HGBA1C 6.8 09/01/2017   HGBA1C 6.7 05/28/2017   HGBA1C 6.6 02/06/2017   HGBA1C 6.6 11/01/2016   HGBA1C 6.4 06/25/2016   HGBA1C 7.2 02/02/2016  02/07/2023: HbA1c calculated from fructosamine is much better than the directly measured HbA1c, at 6.0%! 08/05/2022: HbA1c 6.5% 07/26/2020: HbA1c 6.5% 01/25/2016: 7.4% 04/24/2015: HbA1c 7.9% 04/21/2014: HbA1c 6.9%  She is on: - Metformin  2000 mg with dinner - Trulicity  3 >> 1.5 >> 3 mg weekly - Tresiba  14 >> 16 >> 10 >> 15 (20) units daily She was on Jardiance  25 mg before b'fast. - started 03/2016 - had 1 yeast inf., but continued to have labial itching >> on a steroid cream + Azo cream 3x a day >> but this did not help so she stopped Jardiance . She tried Trulicity  before (2017) and developed nausea and GERD.  No side  effects now. Victoza was not covered. She was previously on glipizide , stopped in 01/2021.  Pt checks her sugars 1-2 times a day: - am: 110-138 >> 96-136, 165, steroid: 203-235 >> 64-135 >> 119-173 - 2h after b'fast:  103-141 >> 111-137 >> 107-137, 181 >> 131-167 - before lunch: 89-97 >> 97-99, 130 >> 98-103 >> 96-98 >> 100-141 - 2h after lunch: 130s >> 113-161 >>  126-157 >> 122-134, 233 >> 131-160 - before dinner: 80s-100 >> 98-155 >> 92-100 >> 98-101 >> 98-119 - 2h after dinner:  130s >> 137-156 >> 127-142 >> 120-193 >> 145-147 - bedtime: n/c >> 117-147, 174 >> 130s >> n/c - nighttime:   135 >> n/c >> 118 >> 96 > n/c Lowest sugar was 79 >> 76 >> 89 >> 79 >> 98; she has hypoglycemia awareness in the 90s. Highest sugar was 174 >> 190 >> 200s - steroids, 260 - steroids >> 175 (steroids0  Glucometer: [employer]io  She has seen nutrition in the past. Now meals are: - 11 am: 2 hard boiled eggs + salad - snacks: cucumbers, celery, berries, apples - dinner: low carb meal No fried foods, no starches  Mild CKD, last BUN/creatinine:  08/13/2023: 14/0.92, GFR 70, glucose 118      Microalbumin Panel Reviewed date:08/01/2022 09:15:03 PM Interpretation:nml Performing Lab: Notes/Report: Testing Performed at: [facility], [address], [place], [place] [address]  MA/CR  ratio <17.1 0.0-30.0 mg/G    UMA <0.7      UCR 41     Lab Results  Component Value Date   BUN 12 04/21/2019   BUN 19 06/25/2016   CREATININE 1.02 04/21/2019   CREATININE 1.03 06/25/2016   Lab Results  Component Value Date   MICRALBCREAT 10 11/11/2023  Not on ACE inhibitor/ARB.  + HL; latest Lipids: 08/13/2023: 151/166/60/64 08/01/2022: 134/118/53/60 Lab Results  Component Value Date   CHOL 144 04/21/2019   HDL 45.50 04/21/2019   LDLCALC 72 04/21/2019   TRIG 130.0 04/21/2019   CHOLHDL 3 04/21/2019  She was previously on Lipitor.  We started Crestor  5 mg daily in 2020 >> no side effects  -  last eye exam was 2025: No DR reportedly.  - no numbness and tingling in her feet.  Last foot exam 64/03/2024. On ASA 81.  Daughter studied dancing in [place] - started the Fall of 2020.  ROS: + see HPI  I reviewed pt's medications, allergies, PMH, social hx, family hx, and changes were documented in the history of present illness. Otherwise, unchanged from my initial visit note.  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    Social History Main Topics   Smoking status: Former Smoker    Types: Cigarettes    Quit date: 1997   Smokeless tobacco: Not on file   Alcohol use Yes     Comment: 1-2 year   Drug use: no   Current Outpatient Medications on File Prior to Visit  Medication Sig Dispense Refill   BD PEN NEEDLE NANO 2ND GEN 32G X 4 MM MISC USE AS DIRECTED TO INJECT INSULIN  EVERY DAY 100 each PRN   Blood Glucose Monitoring Suppl (ONETOUCH VERIO FLEX SYSTEM) w/Device KIT Use as advised 1 kit 0   diphenhydrAMINE (BENADRYL) 25 mg capsule Take 25 mg by mouth 2 (two) times daily.     Dulaglutide  (TRULICITY ) 3 MG/0.5ML SOAJ Inject 3 mg into the skin once a week. 6 mL 3   insulin  degludec (TRESIBA  FLEXTOUCH) 100 UNIT/ML FlexTouch Pen Inject 15-20 Units into the skin daily. INJECT 10 UNITS EVERY NIGHT 30 mL 3   Lancets (ONETOUCH DELICA PLUS LANCET33G) MISC 1 Needle by Other route 2 (two) times daily. 200 each 3   metFORMIN  (GLUCOPHAGE ) 1000 MG tablet Take 2 tablets (2,000 mg total) by mouth daily with supper. 180 tablet 3   ONETOUCH VERIO test strip Use to check blood sugar twice a day 200 strip 12   OVER THE COUNTER MEDICATION True Diet Drops (5 drops 3 times a day)     OVER THE COUNTER  MEDICATION True Keto (5 drops 3 times a day)     OVER THE COUNTER MEDICATION B12 (20 drops once a day)     OVER THE COUNTER MEDICATION Livaplex (1 po daily)     rosuvastatin  (CRESTOR ) 5 MG tablet TAKE ONE TAB BY MOUTH DAILY 90 tablet 2   No current facility-administered medications on file prior to visit.   Allergies  Allergen Reactions   Codeine Nausea And Vomiting   Morphine And Codeine Nausea And Vomiting   Family History  Problem Relation  Age of Onset   COPD Mother    Diverticulitis Father    Crohn's disease Brother    PGF and F with DM.  PE: BP 120/70   Pulse 72   Ht 5' 8 (1.727 m)   Wt 200 lb 6.4 oz (90.9 kg)   SpO2 98%   BMI 30.47 kg/m  Wt Readings from Last 3 Encounters:  05/14/24 200 lb 6.4 oz (90.9 kg)  11/11/23 197 lb (89.4 kg)  06/18/23 199 lb 3.2 oz (90.4 kg)   Constitutional: overweight, in NAD Eyes:  EOMI, no exophthalmos ENT: no neck masses, no cervical lymphadenopathy Cardiovascular: RRR, No MRG Respiratory: CTA B Musculoskeletal: no deformities Skin:no rashes Neurological: no tremor with outstretched hands  ASSESSMENT: 1. DM2, insulin -dependent, uncontrolled, without long term complications, but with hyperglycemia  In 2019, we ruled out type 1 diabetes: Component     Latest Ref Rng & Units 09/01/2017  Hemoglobin A1C      6.8  C-Peptide     0.80 - 3.85 ng/mL 2.87  Islet Cell Ab     Neg:<1:1 Negative  Glutamic Acid Decarb Ab     <5 IU/mL <5  Glucose, Plasma     65 - 99 mg/dL 827 (H)  ZNT8 Antibodies     U/mL <15   2. Overweight  3. HL  PLAN:  1. Patient with longstanding, uncontrolled type 2 diabetes, on oral antidiabetic regimen with metformin  and also long-acting insulin  and weekly GLP-1 receptor agonist, with improving blood sugars.  At last visit, HbA1c improved from 7.2% to 6.9%.  She had sugars mostly at goal, improved, with only occasional hyperglycemic excursions but she started to adjust her diet and was doing a great job with  this.  She also started to exercise consistently.  We did not change her regimen. - At today's visit sugars are higher, particularly after steroid injections, when they are in the 170s in the morning.  Unfortunately, she is not able to stop the injections for now.  We discussed about strategies to improve the blood sugars including increasing insulin  for increasing the incretin mimetic dose.  I actually suggested to try to switch from Trulicity  to a stronger agent if she agrees to try Mounjaro.  I sent a prescription for the 5 mg weekly dose to her pharmacy.  We can increase the dose if needed.  For now, we will continue the same dose of Tresiba  and metformin . - i advised her to: Patient Instructions  Please continue: - Metformin  2000 mg with dinner - Tresiba  15 units daily (20 units while on steroids)  Try to change from Trulicity  to: - Mounjaro 5 mg weekly   Please return in 4 months with your sugar log  - we checked her HbA1c: 7.4% (higher) - advised to check sugars at different times of the day - 1x a day, rotating check times - advised for yearly eye exams >> she is UTD - return to clinic in 4 months   2. Overweight - at today's visit, BMI is higher, but she weighed with her boots on - Continues on Trulicity  which should also help with weight loss -She lost a significant amount of weight by eating a more plant-based diet, doing intermittent fasting and eating smaller portions and increasing exercise -Per [place] records, she lost 47 pounds between 07/2018 (224 pounds) to 07/2021 (177 pounds).  Afterwards, she gained almost 20 pounds back.  She did start weight training so she build muscle afterwards Before last visit, she lost  2 pounds.  She was adjusting her diet and continuing exercise - Weight is approximately stable since last visit  3. HL - Reviewed latest lipid panel from 08/2023 showing fractions at goal except for slightly elevated triglyceride level at 166 - She continues  Crestor  5 mg daily without side effects  Lela Fendt, MD PhD [facility]

## 2024-05-14 NOTE — Patient Instructions (Addendum)
 Please continue: - Metformin  2000 mg with dinner - Tresiba  15 units daily (20 units while on steroids)  Try to change from Trulicity  to: - Mounjaro 5 mg weekly   Please return in 4 months with your sugar log

## 2024-05-17 ENCOUNTER — Encounter: Payer: Self-pay | Admitting: Internal Medicine

## 2024-05-18 ENCOUNTER — Telehealth: Payer: Self-pay

## 2024-05-18 ENCOUNTER — Other Ambulatory Visit (HOSPITAL_COMMUNITY): Payer: Self-pay

## 2024-05-18 NOTE — Telephone Encounter (Signed)
 Pharmacy Patient Advocate Encounter   Received notification from Pt Calls Messages that prior authorization for Mounjaro  5MG /0.5ML auto-injectors is required/requested.   Insurance verification completed.   The patient is insured through [facility].   Per test claim: PA required; PA started via CoverMyMeds. KEY [id number] . Waiting for clinical questions to populate.

## 2024-05-24 ENCOUNTER — Encounter: Payer: Self-pay | Admitting: Internal Medicine

## 2024-05-24 DIAGNOSIS — E1165 Type 2 diabetes mellitus with hyperglycemia: Secondary | ICD-10-CM

## 2024-05-25 MED ORDER — ACCU-CHEK SOFTCLIX LANCETS MISC: 12 refills | Status: DC

## 2024-05-25 MED ORDER — ACCU-CHEK GUIDE W/DEVICE KIT: PACK | 0 refills | Status: AC

## 2024-05-25 MED ORDER — ACCU-CHEK GUIDE TEST VI STRP: ORAL_STRIP | 12 refills | Status: AC

## 2024-05-31 NOTE — Telephone Encounter (Signed)
 Pharmacy Patient Advocate Encounter  Received notification from [facility] that Prior Authorization for Mounjaro  5MG /0.5ML auto-injectors  has been APPROVED from 05/31/2024 to 06/01/2027   PA #/Case ID/Reference #: [id number]

## 2024-06-02 MED ORDER — ACCU-CHEK SOFTCLIX LANCETS MISC: 12 refills | Status: AC

## 2024-06-02 NOTE — Addendum Note (Signed)
 Addended by: CLEOTILDE ROLIN RAMAN on: 06/02/2024 01:09 PM   Modules accepted: Orders

## 2024-09-17 ENCOUNTER — Ambulatory Visit: Admitting: Internal Medicine
